# Patient Record
Sex: Female | Born: 1990 | Hispanic: No | Marital: Married | Smoking: Never smoker
Health system: Southern US, Community
[De-identification: ages and names within clinical notes are randomized; demographics above are authoritative.]

## PROBLEM LIST (undated history)

## (undated) DIAGNOSIS — R87629 Unspecified abnormal cytological findings in specimens from vagina: Secondary | ICD-10-CM

## (undated) DIAGNOSIS — N83209 Unspecified ovarian cyst, unspecified side: Secondary | ICD-10-CM

## (undated) DIAGNOSIS — G43009 Migraine without aura, not intractable, without status migrainosus: Secondary | ICD-10-CM

## (undated) DIAGNOSIS — Q612 Polycystic kidney, adult type: Secondary | ICD-10-CM

## (undated) DIAGNOSIS — J45909 Unspecified asthma, uncomplicated: Secondary | ICD-10-CM

## (undated) DIAGNOSIS — Q613 Polycystic kidney, unspecified: Secondary | ICD-10-CM

## (undated) DIAGNOSIS — N912 Amenorrhea, unspecified: Secondary | ICD-10-CM

## (undated) DIAGNOSIS — N390 Urinary tract infection, site not specified: Secondary | ICD-10-CM

## (undated) DIAGNOSIS — F419 Anxiety disorder, unspecified: Secondary | ICD-10-CM

## (undated) DIAGNOSIS — I10 Essential (primary) hypertension: Secondary | ICD-10-CM

## (undated) HISTORY — DX: Anxiety disorder, unspecified: F41.9

## (undated) HISTORY — DX: Amenorrhea, unspecified: N91.2

## (undated) HISTORY — DX: Unspecified asthma, uncomplicated: J45.909

## (undated) HISTORY — DX: Migraine without aura, not intractable, without status migrainosus: G43.009

## (undated) HISTORY — PX: WISDOM TOOTH EXTRACTION: SHX21

---

## 2012-08-10 ENCOUNTER — Ambulatory Visit (INDEPENDENT_AMBULATORY_CARE_PROVIDER_SITE_OTHER): Payer: BC Managed Care – PPO | Admitting: Family

## 2012-08-10 ENCOUNTER — Encounter: Payer: Self-pay | Admitting: Family

## 2012-08-10 VITALS — BP 120/78 | HR 87 | Ht 59.5 in | Wt 156.0 lb

## 2012-08-10 DIAGNOSIS — R42 Dizziness and giddiness: Secondary | ICD-10-CM

## 2012-08-10 DIAGNOSIS — G43909 Migraine, unspecified, not intractable, without status migrainosus: Secondary | ICD-10-CM

## 2012-08-10 LAB — CBC WITH DIFFERENTIAL/PLATELET
Basophils Absolute: 0 10*3/uL (ref 0.0–0.1)
HCT: 40.8 % (ref 36.0–46.0)
Lymphs Abs: 2.5 10*3/uL (ref 0.7–4.0)
MCV: 92.7 fl (ref 78.0–100.0)
Monocytes Absolute: 0.7 10*3/uL (ref 0.1–1.0)
Platelets: 222 10*3/uL (ref 150.0–400.0)
RDW: 12.8 % (ref 11.5–14.6)

## 2012-08-10 LAB — COMPREHENSIVE METABOLIC PANEL
ALT: 17 U/L (ref 0–35)
AST: 20 U/L (ref 0–37)
CO2: 23 mEq/L (ref 19–32)
Creatinine, Ser: 0.6 mg/dL (ref 0.4–1.2)
GFR: 127.43 mL/min (ref >60.00)
Total Bilirubin: 0.6 mg/dL (ref 0.3–1.2)

## 2012-08-10 LAB — TSH: TSH: 0.41 u[IU]/mL (ref 0.35–5.50)

## 2012-08-10 NOTE — Progress Notes (Signed)
  Subjective:    Patient ID: Rebecca [name], female    DOB: 1990-10-29, 22 y.o.   MRN: [id number]  HPI 22 year old white female, nonsmoker, new patient to the practice is in with complaints of lightheadedness that occurred 2 weeks ago while in gym class. She believes that the episode occurred because she did not eat breakfast prior to physical activity. However, she's requesting to have blood work to check for anemia and her blood sugar. 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   Review of Systems  Constitutional: Negative.   HENT: Negative.   Respiratory: Negative.   Cardiovascular: Negative.   Gastrointestinal: Negative.   Endocrine: Negative.   Genitourinary: Negative.   Musculoskeletal: Negative.   Skin: Negative.   Allergic/Immunologic: Negative.   Neurological: Positive for light-headedness.  Hematological: Negative.   Psychiatric/Behavioral: Negative.    History reviewed. No pertinent past medical history.  History   Social History  . Marital Status: Single    Spouse Name: N/A    Number of Children: N/A  . Years of Education: N/A   Occupational History  . Not on file.   Social History Main Topics  . Smoking status: Never Smoker   . Smokeless tobacco: Not on file  . Alcohol Use: No  . Drug Use: No  . Sexually Active: Not on file   Other Topics Concern  . Not on file   Social History Narrative  . No narrative on file    History reviewed. No pertinent past surgical history.  No family history on file.  No Known Allergies  No current outpatient prescriptions on file prior to visit.   No current facility-administered medications on file prior to visit.    BP 120/78  Pulse 87  Ht 4' 11.5" (1.511 m)  Wt 156 lb (70.761 kg)  BMI 30.99 kg/m2  SpO2 98%  LMP 02/21/2014chart    Objective:   Physical Exam  Constitutional: She is oriented to person, place, and time. She appears well-developed and well-nourished.  HENT:   Right Ear: External ear normal.  Left Ear: External ear normal.  Nose: Nose normal.  Mouth/Throat: Oropharynx is clear and moist.  Neck: Normal range of motion. Neck supple.  Cardiovascular: Normal rate, regular rhythm and normal heart sounds.   Pulmonary/Chest: Effort normal and breath sounds normal.  Abdominal: Soft. Bowel sounds are normal.  Musculoskeletal: Normal range of motion.  Neurological: She is alert and oriented to person, place, and time.  Skin: Skin is warm and dry.  Psychiatric: She has a normal mood and affect.          Assessment & Plan:  Assessment:  1. Lightheadedness  Plan: CBC, TSH, CMP sent. Will notify patient pending results. Encouraged healthy breakfast prior to physical activity. Followup in September for Pap smear.

## 2012-08-10 NOTE — Patient Instructions (Addendum)
Migraine Headache A migraine headache is an intense, throbbing pain on one or both sides of your head. A migraine can last for 30 minutes to several hours. CAUSES  The exact cause of a migraine headache is not always known. However, a migraine may be caused when nerves in the brain become irritated and release chemicals that cause inflammation. This causes pain. SYMPTOMS  Pain on one or both sides of your head.  Pulsating or throbbing pain.  Severe pain that prevents daily activities.  Pain that is aggravated by any physical activity.  Nausea, vomiting, or both.  Dizziness.  Pain with exposure to bright lights, loud noises, or activity.  General sensitivity to bright lights, loud noises, or smells. Before you get a migraine, you may get warning signs that a migraine is coming (aura). An aura may include:  Seeing flashing lights.  Seeing bright spots, halos, or zig-zag lines.  Having tunnel vision or blurred vision.  Having feelings of numbness or tingling.  Having trouble talking.  Having muscle weakness. MIGRAINE TRIGGERS  Alcohol.  Smoking.  Stress.  Menstruation.  Aged cheeses.  Foods or drinks that contain nitrates, glutamate, aspartame, or tyramine.  Lack of sleep.  Chocolate.  Caffeine.  Hunger.  Physical exertion.  Fatigue.  Medicines used to treat chest pain (nitroglycerine), birth control pills, estrogen, and some blood pressure medicines. DIAGNOSIS  A migraine headache is often diagnosed based on:  Symptoms.  Physical examination.  A CT scan or MRI of your head. TREATMENT Medicines may be given for pain and nausea. Medicines can also be given to help prevent recurrent migraines.  HOME CARE INSTRUCTIONS  Only take over-the-counter or prescription medicines for pain or discomfort as directed by your caregiver. The use of long-term narcotics is not recommended.  Lie down in a dark, quiet room when you have a migraine.  Keep a journal  to find out what may trigger your migraine headaches. For example, write down:  What you eat and drink.  How much sleep you get.  Any change to your diet or medicines.  Limit alcohol consumption.  Quit smoking if you smoke.  Get 7 to 9 hours of sleep, or as recommended by your caregiver.  Limit stress.  Keep lights dim if bright lights bother you and make your migraines worse. SEEK IMMEDIATE MEDICAL CARE IF:   Your migraine becomes severe.  You have a fever.  You have a stiff neck.  You have vision loss.  You have muscular weakness or loss of muscle control.  You start losing your balance or have trouble walking.  You feel faint or pass out.  You have severe symptoms that are different from your first symptoms. MAKE SURE YOU:   Understand these instructions.  Will watch your condition.  Will get help right away if you are not doing well or get worse. Document Released: 05/26/2005 Document Revised: 08/18/2011 Document Reviewed: 05/16/2011 [facility] Patient Information 2013 [place], [place].   Breast Self-Exam A self breast exam may help you find changes or problems while they are still small. Do a breast self-exam:  Every month.  One week after your period (menstrual period).  On the first day of each month if you do not have periods anymore. Look for any:  Change in breast color, size, or shape.  Dimples in your breast.  Changes in your nipples or skin.  Dry skin on your breasts or nipples.  Watery or bloody discharge from your nipples.  Feel for:  Lumps.  Thick, hard places.  Any other changes. HOME CARE There are 3 ways to do the breast self-exam: In front of a mirror.  Lift your arms over your head and turn side to side.  Put your hands on your hips and lean down, then turn from side to side.  Bend forward and turn from side to side. In the shower.  With soapy hands, check both breasts. Then check above and below your collarbone and  your armpits.  Feel above and below your collarbone down to under your breast, and from the center of your chest to the outer edge of the armpit. Check for any lumps or hard spots.  Using the tips of your middle three fingers check your whole breast by pressing your hand over your breast in a circle or in an up and down motion. Lying down.  Lie flat on your bed.  Put a small pillow under the breast you are going to check. On that same side, put your hand behind your head.  With your other hand, use the 3 middle fingers to feel the breast.  Move your fingers in a circle around the breast. Press firmly over all parts of the breast to feel for any lumps. GET HELP RIGHT AWAY IF: You find any changes in your breasts so they can be checked. Document Released: 11/12/2007 Document Revised: 08/18/2011 Document Reviewed: 09/13/2008 [facility] Patient Information 2013 [place], [place].

## 2013-01-14 ENCOUNTER — Telehealth: Payer: Self-pay | Admitting: Family

## 2013-01-14 ENCOUNTER — Encounter: Payer: Self-pay | Admitting: Family Medicine

## 2013-01-14 ENCOUNTER — Ambulatory Visit (INDEPENDENT_AMBULATORY_CARE_PROVIDER_SITE_OTHER): Payer: BC Managed Care – PPO | Admitting: Family Medicine

## 2013-01-14 VITALS — BP 120/80 | Temp 99.4°F | Wt 157.0 lb

## 2013-01-14 DIAGNOSIS — N946 Dysmenorrhea, unspecified: Secondary | ICD-10-CM

## 2013-01-14 NOTE — Progress Notes (Signed)
Chief Complaint  Patient presents with  . Abdominal Cramping    nausea this morning; dizziness     HPI:  Acute visit for menstrual cramping: -abd cramping on day periods start with periods - usually mild, sometimes worse -FDLMP: started today -had some cramping and nausea this morning with a little lightheadedness with the nausea -periods monthly, regular, 5-7 days, moderate flow -per review of chart had cbc, cmp and tsh recently and all were normal -denies: fevers, chills, urinary symptoms, blood in stools, vomiting, diarrhea  ROS: See pertinent positives and negatives per HPI.  No past medical history on file.  No family history on file.  History   Social History  . Marital Status: Single    Spouse Name: N/A    Number of Children: N/A  . Years of Education: N/A   Social History Main Topics  . Smoking status: Never Smoker   . Smokeless tobacco: None  . Alcohol Use: No  . Drug Use: No  . Sexually Active: None   Other Topics Concern  . None   Social History Narrative  . None    No current outpatient prescriptions on file.  EXAM:  Filed Vitals:   01/14/13 1525  BP: 120/80  Temp: 99.4 F (37.4 C)    Body mass index is 31.19 kg/(m^2).  GENERAL: vitals reviewed and listed above, alert, oriented, appears well hydrated and in no acute distress  HEENT: atraumatic, conjunttiva clear, no obvious abnormalities on inspection of external nose and ears  NECK: no obvious masses on inspection  LUNGS: clear to auscultation bilaterally, no wheezes, rales or rhonchi, good air movement  CV: HRRR, no peripheral edema  ABD: BS+, soft, NTTP, no rebound or guarding  MS: moves all extremities without noticeable abnormality  PSYCH: pleasant and cooperative, no obvious depression or anxiety  ASSESSMENT AND PLAN:  Discussed the following assessment and plan:  Menstrual cramps  -mild menstrual cramps today with normal exam and feeling better now -discussed  dysmenorrhea, causes, tx -will start with ibuprofen with periods as needed -follow up with PCP in 2 months -Patient advised to return or notify a doctor immediately if symptoms worsen or persist or new concerns arise.  There are no Patient Instructions on file for this visit.   Kriste Basque R.

## 2013-01-14 NOTE — Telephone Encounter (Signed)
Patient Information:  Caller Name: [name]  Phone: [phone]  Patient: Rebecca [name], Rebecca [name]  Gender: Female  DOB: April 18, 1991  Age: 22 Years  PCP: Adline Mango [facility])  Pregnant: No  Office Follow Up:  Does the office need to follow up with this patient?: No  Instructions For The Office: N/A   Symptoms  Reason For Call & Symptoms: Started period today 01/14/2013.  Woke with abdominal cramping at 10 am today.  Usually cramping but not this severe with periods.  At first pacing floor, now able to sit.  Says abdomen non-tender.  Some lightheadedness and dizzines at first but that has improved.  Some nausea at times.  Current pain at 7/10 level has not decreased since Rx at 12:30 pm  Reviewed Health History In EMR: Yes  Reviewed Medications In EMR: Yes  Reviewed Allergies In EMR: Yes  Reviewed Surgeries / Procedures: Yes  Date of Onset of Symptoms: 01/14/2013  Treatments Tried: Took Advil 3 pills at 12:30 pm  Treatments Tried Worked: No OB / GYN:  LMP: 01/14/2013  Guideline(s) Used:  Abdominal Pain - Female  Disposition Per Guideline:   Go to ED Now (or to Office with PCP Approval)  Reason For Disposition Reached:   Constant abdominal pain lasting > 2 hours  Advice Given:  Rest:  Lie down and rest until you feel better.  Fluids:  Sip clear fluids only (e.g., water, flat soft drinks or 1/2 strength fruit juice) until the pain has been gone for over 2 hours. Then slowly return to a regular diet.  Call Back If:  You become worse.  Patient Will Follow Care Advice:  YES  Appointment Scheduled:  01/14/2013 15:15:00 Appointment Scheduled Provider:  Kriste Basque [facility])

## 2013-01-14 NOTE — Telephone Encounter (Signed)
Noted  

## 2013-09-05 ENCOUNTER — Ambulatory Visit (INDEPENDENT_AMBULATORY_CARE_PROVIDER_SITE_OTHER): Payer: BC Managed Care – PPO | Admitting: Family

## 2013-09-05 ENCOUNTER — Encounter: Payer: Self-pay | Admitting: Family

## 2013-09-05 VITALS — BP 108/64 | HR 104 | Temp 98.8°F | Wt 160.0 lb

## 2013-09-05 DIAGNOSIS — J029 Acute pharyngitis, unspecified: Secondary | ICD-10-CM

## 2013-09-05 NOTE — Progress Notes (Signed)
   Subjective:    Patient ID: Rebecca [name], female    DOB: 05/12/1991, 23 y.o.   MRN: [id number]  HPI 23 y.o. White female presents today with chief complaint of "sore throat". Pt states that she had a cold 2 weeks ago and continues to have a sore throat. She states that she developed a low grade fever on Friday and that her throat was very sore on Sunday and her mother "thinks she saw pus". Pt rates pain as a 3 on a scale of 0-10 and describes it as scratch pain; the pain is constant but improving; she has not taken any medicine. Pt denies any chills, fatigue, malaise and change in appetite. Overall, pt states she feels much better then yesterday.     Review of Systems  Constitutional: Negative.   HENT: Positive for postnasal drip and sore throat.   Eyes: Negative.   Respiratory: Negative.   Cardiovascular: Negative.   Gastrointestinal: Negative.   Endocrine: Negative.   Genitourinary: Negative.   Musculoskeletal: Negative.   Skin: Negative.   Allergic/Immunologic: Negative.   Neurological: Positive for headaches.  Hematological: Negative.   Psychiatric/Behavioral: Negative.    No past medical history on file.  History   Social History  . Marital Status: Single    Spouse Name: N/A    Number of Children: N/A  . Years of Education: N/A   Occupational History  . Not on file.   Social History Main Topics  . Smoking status: Never Smoker   . Smokeless tobacco: Not on file  . Alcohol Use: No  . Drug Use: No  . Sexual Activity: Not on file   Other Topics Concern  . Not on file   Social History Narrative  . No narrative on file    No past surgical history on file.  No family history on file.  No Known Allergies  No current outpatient prescriptions on file prior to visit.   No current facility-administered medications on file prior to visit.    BP 108/64  Pulse 104  Temp(Src) 98.8 F (37.1 C) (Oral)  Wt 160 lb (72.576 kg)chart    Objective:   Physical Exam    Constitutional: She is oriented to person, place, and time. She appears well-developed and well-nourished. She is active.  HENT:  Head: Normocephalic.  Right Ear: Tympanic membrane, external ear and ear canal normal.  Left Ear: Tympanic membrane, external ear and ear canal normal.  Nose: Nose normal.  Mouth/Throat: Uvula is midline and mucous membranes are normal. Posterior oropharyngeal erythema present.  Neck: Normal range of motion. Neck supple.  Cardiovascular: Normal rate, regular rhythm, S1 normal, S2 normal and normal heart sounds.   Pulmonary/Chest: Effort normal and breath sounds normal.  Abdominal: Soft. Normal appearance and bowel sounds are normal.  Neurological: She is alert and oriented to person, place, and time.  Skin: Skin is warm, dry and intact.  Psychiatric: She has a normal mood and affect. Her speech is normal and behavior is normal. Thought content normal.          Assessment & Plan:  23 y.o. White female presents with chief complaint of "sore throat".  - Sore throat- Rapid strep test negative.  - IBuprofen 800mg  po TID for pain  - Warm salt water gargles as needed for sore throat.   - Follow up: as needed

## 2013-09-05 NOTE — Patient Instructions (Signed)
Take IBupofen up to 800mg  3x per day for sore throat as needed.   Sore Throat A sore throat is pain, burning, irritation, or scratchiness of the throat. There is often pain or tenderness when swallowing or talking. A sore throat may be accompanied by other symptoms, such as coughing, sneezing, fever, and swollen neck glands. A sore throat is often the first sign of another sickness, such as a cold, flu, strep throat, or mononucleosis (commonly known as mono). Most sore throats go away without medical treatment. CAUSES  The most common causes of a sore throat include:  A viral infection, such as a cold, flu, or mono.  A bacterial infection, such as strep throat, tonsillitis, or whooping cough.  Seasonal allergies.  Dryness in the air.  Irritants, such as smoke or pollution.  Gastroesophageal reflux disease (GERD). HOME CARE INSTRUCTIONS   Only take over-the-counter medicines as directed by your caregiver.  Drink enough fluids to keep your urine clear or pale yellow.  Rest as needed.  Try using throat sprays, lozenges, or sucking on hard candy to ease any pain (if older than 4 years or as directed).  Sip warm liquids, such as broth, herbal tea, or warm water with honey to relieve pain temporarily. You may also eat or drink cold or frozen liquids such as frozen ice pops.  Gargle with salt water (mix 1 tsp salt with 8 oz of water).  Do not smoke and avoid secondhand smoke.  Put a cool-mist humidifier in your bedroom at night to moisten the air. You can also turn on a hot shower and sit in the bathroom with the door closed for 5 10 minutes. SEEK IMMEDIATE MEDICAL CARE IF:  You have difficulty breathing.  You are unable to swallow fluids, soft foods, or your saliva.  You have increased swelling in the throat.  Your sore throat does not get better in 7 days.  You have nausea and vomiting.  You have a fever or persistent symptoms for more than 2 3 days.  You have a fever and  your symptoms suddenly get worse. MAKE SURE YOU:   Understand these instructions.  Will watch your condition.  Will get help right away if you are not doing well or get worse. Document Released: 07/03/2004 Document Revised: 05/12/2012 Document Reviewed: 02/01/2012 [facility]ExitCare Patient Information 2014 [place].

## 2013-09-05 NOTE — Progress Notes (Signed)
Pre visit review using our clinic review tool, if applicable. No additional management support is needed unless otherwise documented below in the visit note. 

## 2014-10-31 ENCOUNTER — Ambulatory Visit: Payer: Self-pay | Admitting: Obstetrics & Gynecology

## 2014-11-03 ENCOUNTER — Ambulatory Visit (INDEPENDENT_AMBULATORY_CARE_PROVIDER_SITE_OTHER): Payer: BLUE CROSS/BLUE SHIELD | Admitting: Obstetrics & Gynecology

## 2014-11-03 ENCOUNTER — Encounter: Payer: Self-pay | Admitting: Obstetrics & Gynecology

## 2014-11-03 VITALS — BP 108/70 | HR 80 | Resp 20 | Ht 60.0 in | Wt 158.0 lb

## 2014-11-03 DIAGNOSIS — Z124 Encounter for screening for malignant neoplasm of cervix: Secondary | ICD-10-CM

## 2014-11-03 DIAGNOSIS — Z01419 Encounter for gynecological examination (general) (routine) without abnormal findings: Secondary | ICD-10-CM

## 2014-11-03 DIAGNOSIS — G43009 Migraine without aura, not intractable, without status migrainosus: Secondary | ICD-10-CM | POA: Insufficient documentation

## 2014-11-03 HISTORY — DX: Migraine without aura, not intractable, without status migrainosus: G43.009

## 2014-11-03 MED ORDER — IBUPROFEN 800 MG PO TABS: 800.0000 mg | ORAL_TABLET | Freq: Three times a day (TID) | ORAL | Status: DC | PRN

## 2014-11-03 NOTE — Progress Notes (Signed)
24 y.o. G0P0000 SingleCaucasianF here for new patient appt/ annual exam.  Pt reports having worsening increased cramping at the beginning of her cycles.  Cycles are regular.  Flow lasts 5 days.  Heavier flow if for four days.  Changes four to five times a day.  Has tried advil for cramping and this does help.  Pt take two to three Advil twice daily if needed.    Completed Gardisil series.    Graduating this summer.  Works at [employer] at [employer].   Patient's last menstrual period was 10/11/2014.          Sexually active: No.  The current method of family planning is none.    Exercising: No.  not regularly Smoker:  no  Health Maintenance: Pap:  none History of abnormal Pap:  no MMG:  none Colonoscopy:  none BMD:   none TDaP:  UTD Screening Labs: PCP, Hb today: PCP-recent for vertigo, Urine today: not able to give sample   reports that she has never smoked. She has never used smokeless tobacco. She reports that she does not drink alcohol or use illicit drugs.  No past medical history on file.  Past Surgical History  Procedure Laterality Date  . Wisdom tooth extraction  2012/2013    Current Outpatient Prescriptions  Medication Sig Dispense Refill  . amoxicillin (AMOXIL) 500 MG capsule As needed  0   No current facility-administered medications for this visit.    Family History  Problem Relation Age of Onset  . Brain cancer Paternal Grandmother   . Diabetes Paternal Grandfather   . Diabetes Paternal Uncle   . Heart attack Maternal Grandfather   . Heart attack Paternal Grandfather   . Hypertension Mother   . Hypertension Father   . Hypertension Sister   . Polycystic kidney disease Mother     ROS:  Pertinent items are noted in HPI.  Otherwise, a comprehensive ROS was negative.  Exam:   BP 108/70 mmHg  Pulse 80  Resp 20  Ht 5' (1.524 m)  Wt 158 lb (71.668 kg)  BMI 30.86 kg/m2  LMP 10/11/2014  Height: 5' (152.4 cm)  Ht Readings from Last 3 Encounters:   11/03/14 5' (1.524 m)  08/10/12 4' 11.5" (1.511 m)    General appearance: alert, cooperative and appears stated age Head: Normocephalic, without obvious abnormality, atraumatic Neck: no adenopathy, supple, symmetrical, trachea midline and thyroid normal to inspection and palpation Lungs: clear to auscultation bilaterally Breasts: normal appearance, no masses or tenderness Heart: regular rate and rhythm Abdomen: soft, non-tender; bowel sounds normal; no masses,  no organomegaly Extremities: extremities normal, atraumatic, no cyanosis or edema Skin: Skin color, texture, turgor normal. No rashes or lesions Lymph nodes: Cervical, supraclavicular, and axillary nodes normal. No abnormal inguinal nodes palpated Neurologic: Grossly normal   Pelvic: External genitalia:  no lesions              Urethra:  normal appearing urethra with no masses, tenderness or lesions              Bartholins and Skenes: normal                 Vagina: normal appearing vagina with normal color and discharge, no lesions              Cervix: no lesions              Pap taken: Yes.   Bimanual Exam:  Uterus:  normal size, contour, position, consistency,  mobility, non-tender              Adnexa: normal adnexa and no mass, fullness, tenderness               Rectovaginal: Confirms               Anus:  normal sphincter tone, no lesions  Chaperone was present for exam.  A:  Well Woman with normal exam Migraine without aura  P:   Mammogram starting age 23-45 pap smear today Ibuprofen 800mg  every 8 hrs as needed for cramping.  #60/1Rf return annually or prn

## 2014-11-03 NOTE — Addendum Note (Signed)
Addended by: Jerene BearsMILLER, Ishmail Mcmanamon S on: 11/03/2014 05:15 PM   Modules accepted: Orders

## 2014-11-09 LAB — IPS PAP TEST WITH REFLEX TO HPV

## 2015-06-08 ENCOUNTER — Other Ambulatory Visit: Payer: Self-pay | Admitting: Gastroenterology

## 2015-06-08 DIAGNOSIS — R1013 Epigastric pain: Secondary | ICD-10-CM

## 2015-06-19 ENCOUNTER — Other Ambulatory Visit: Payer: Self-pay

## 2015-06-26 ENCOUNTER — Ambulatory Visit
Admission: RE | Admit: 2015-06-26 | Discharge: 2015-06-26 | Disposition: A | Discharge status: Home / Self Care | Payer: BLUE CROSS/BLUE SHIELD | Source: Ambulatory Visit | Attending: Gastroenterology | Admitting: Gastroenterology

## 2015-06-26 DIAGNOSIS — R1013 Epigastric pain: Secondary | ICD-10-CM

## 2016-09-01 ENCOUNTER — Other Ambulatory Visit: Payer: Self-pay | Admitting: Nephrology

## 2016-09-01 DIAGNOSIS — R109 Unspecified abdominal pain: Secondary | ICD-10-CM

## 2016-09-04 ENCOUNTER — Ambulatory Visit
Admission: RE | Admit: 2016-09-04 | Discharge: 2016-09-04 | Disposition: A | Discharge status: Home / Self Care | Payer: BLUE CROSS/BLUE SHIELD | Source: Ambulatory Visit | Attending: Nephrology | Admitting: Nephrology

## 2016-09-04 DIAGNOSIS — R109 Unspecified abdominal pain: Secondary | ICD-10-CM

## 2017-03-25 ENCOUNTER — Emergency Department (HOSPITAL_COMMUNITY)
Admission: EM | Admit: 2017-03-25 | Discharge: 2017-03-25 | Disposition: A | Discharge status: Home / Self Care | Payer: BLUE CROSS/BLUE SHIELD | Attending: Emergency Medicine | Admitting: Emergency Medicine

## 2017-03-25 ENCOUNTER — Encounter (HOSPITAL_COMMUNITY): Payer: Self-pay

## 2017-03-25 DIAGNOSIS — R112 Nausea with vomiting, unspecified: Secondary | ICD-10-CM | POA: Insufficient documentation

## 2017-03-25 DIAGNOSIS — Q613 Polycystic kidney, unspecified: Secondary | ICD-10-CM | POA: Diagnosis not present

## 2017-03-25 DIAGNOSIS — R197 Diarrhea, unspecified: Secondary | ICD-10-CM | POA: Diagnosis present

## 2017-03-25 HISTORY — DX: Polycystic kidney, unspecified: Q61.3

## 2017-03-25 LAB — COMPREHENSIVE METABOLIC PANEL WITH GFR
ALT: 11 U/L — ABNORMAL LOW (ref 14–54)
AST: 17 U/L (ref 15–41)
Albumin: 4.2 g/dL (ref 3.5–5.0)
Alkaline Phosphatase: 72 U/L (ref 38–126)
Anion gap: 8 (ref 5–15)
BUN: 10 mg/dL (ref 6–20)
CO2: 22 mmol/L (ref 22–32)
Calcium: 9.3 mg/dL (ref 8.9–10.3)
Chloride: 107 mmol/L (ref 101–111)
Creatinine, Ser: 0.71 mg/dL (ref 0.44–1.00)
GFR calc Af Amer: >60 mL/min
GFR calc non Af Amer: >60 mL/min
Glucose, Bld: 96 mg/dL (ref 65–99)
Potassium: 3.9 mmol/L (ref 3.5–5.1)
Sodium: 137 mmol/L (ref 135–145)
Total Bilirubin: 0.6 mg/dL (ref 0.3–1.2)
Total Protein: 6.7 g/dL (ref 6.5–8.1)

## 2017-03-25 LAB — CBC
HCT: 39.8 % (ref 36.0–46.0)
HEMOGLOBIN: 13.4 g/dL (ref 12.0–15.0)
MCH: 30.6 pg (ref 26.0–34.0)
MCHC: 33.7 g/dL (ref 30.0–36.0)
MCV: 90.9 fL (ref 78.0–100.0)
Platelets: 205 10*3/uL (ref 150–400)
RBC: 4.38 MIL/uL (ref 3.87–5.11)
RDW: 12.1 % (ref 11.5–15.5)
WBC: 8.9 10*3/uL (ref 4.0–10.5)

## 2017-03-25 LAB — URINALYSIS, ROUTINE W REFLEX MICROSCOPIC
BILIRUBIN URINE: NEGATIVE
Glucose, UA: NEGATIVE mg/dL
Hgb urine dipstick: NEGATIVE
Ketones, ur: 20 mg/dL — AB
Leukocytes, UA: NEGATIVE
NITRITE: NEGATIVE
PROTEIN: NEGATIVE mg/dL
SPECIFIC GRAVITY, URINE: 1.012 (ref 1.005–1.030)
pH: 6 (ref 5.0–8.0)

## 2017-03-25 LAB — LIPASE, BLOOD: Lipase: 27 U/L (ref 11–51)

## 2017-03-25 LAB — I-STAT BETA HCG BLOOD, ED (MC, WL, AP ONLY): I-stat hCG, quantitative: <5 m[IU]/mL

## 2017-03-25 MED ORDER — LOPERAMIDE HCL 2 MG PO CAPS
2.0000 mg | ORAL_CAPSULE | Freq: Once | ORAL | Status: AC
  Administered 2017-03-25: 2 mg via ORAL
  Filled 2017-03-25: qty 1

## 2017-03-25 MED ORDER — PANTOPRAZOLE SODIUM 40 MG IV SOLR
40.0000 mg | Freq: Once | INTRAVENOUS | Status: AC
  Administered 2017-03-25: 40 mg via INTRAVENOUS
  Filled 2017-03-25: qty 40

## 2017-03-25 MED ORDER — DICYCLOMINE HCL 20 MG PO TABS: 20.0000 mg | ORAL_TABLET | Freq: Three times a day (TID) | ORAL | 0 refills | Status: DC

## 2017-03-25 MED ORDER — SODIUM CHLORIDE 0.9 % IV BOLUS (SEPSIS)
1000.0000 mL | Freq: Once | INTRAVENOUS | Status: AC
  Administered 2017-03-25: 1000 mL via INTRAVENOUS

## 2017-03-25 MED ORDER — LOPERAMIDE HCL 2 MG PO CAPS: 2.0000 mg | ORAL_CAPSULE | Freq: Four times a day (QID) | ORAL | 0 refills | Status: DC | PRN

## 2017-03-25 MED ORDER — DICYCLOMINE HCL 10 MG/ML IM SOLN
20.0000 mg | Freq: Once | INTRAMUSCULAR | Status: AC
  Administered 2017-03-25: 20 mg via INTRAMUSCULAR
  Filled 2017-03-25: qty 2

## 2017-03-25 MED ORDER — ONDANSETRON 4 MG PO TBDP: 4.0000 mg | ORAL_TABLET | Freq: Four times a day (QID) | ORAL | 0 refills | Status: DC | PRN

## 2017-03-25 MED ORDER — ONDANSETRON HCL 4 MG/2ML IJ SOLN
4.0000 mg | Freq: Once | INTRAMUSCULAR | Status: AC
  Administered 2017-03-25: 4 mg via INTRAVENOUS
  Filled 2017-03-25: qty 2

## 2017-03-25 NOTE — ED Triage Notes (Addendum)
Pt coming from home. Pt states over the last two week she has had stomach pains and nausea and tonight it got worse and started to throw up and have severe abdominal pain. Pt also states she has been having diarrhea for over a week thought she had the stomach bug.

## 2017-03-25 NOTE — ED Provider Notes (Signed)
TIME SEEN: 5:57 AM  CHIEF COMPLAINT: nausea, vomiting and diarrhea  HPI: Pt is a 26 y.o. female with history of polycystic kidney disease who presents to the emergency department with complaints of diarrhea for the past week. She has noted no more than 3 episodes of nonbloody diarrhea every day. She had 2 episodes of clear vomiting today. She feels that she is having upper abdominal discomfort from vomiting and feels bloated and having cramps. No fevers, chills. No dysuria, hematuria, vaginal bleeding or discharge. Last menstrual period was the end of September. No history of previous abdominal surgery. No sick contacts, recent travel, recent antibiotic use or hospitalization.  ROS: See HPI Constitutional: no fever  Eyes: no drainage  ENT: no runny nose   Cardiovascular:  no chest pain  Resp: no SOB  GI: no vomiting GU: no dysuria Integumentary: no rash  Allergy: no hives  Musculoskeletal: no leg swelling  Neurological: no slurred speech ROS otherwise negative  PAST MEDICAL HISTORY/PAST SURGICAL HISTORY:  Past Medical History:  Diagnosis Date  . Polycystic kidney disease     MEDICATIONS:  Prior to Admission medications   Medication Sig Start Date End Date Taking? Authorizing Provider  clidinium-chlordiazePOXIDE (LIBRAX) 5-2.5 MG per capsule Take 1 capsule by mouth as needed.    [provider]  ibuprofen (ADVIL,MOTRIN) 800 MG tablet Take 1 tablet (800 mg total) by mouth every 8 (eight) hours as needed. 11/03/14   Jerene BearsMiller, Mary S, MD    ALLERGIES:  Allergies  Allergen Reactions  . Ibuprofen Anaphylaxis    Angioedema    SOCIAL HISTORY:  Social History  Substance Use Topics  . Smoking status: Never Smoker  . Smokeless tobacco: Never Used  . Alcohol use No    FAMILY HISTORY: Family History  Problem Relation Age of Onset  . Brain cancer Paternal Grandmother   . Diabetes Paternal Grandfather   . Heart attack Paternal Grandfather   . Diabetes Paternal Uncle   .  Heart attack Maternal Grandfather   . Hypertension Mother   . Polycystic kidney disease Mother   . Hypertension Father   . Hypertension Sister     EXAM: BP (!) 126/92   Pulse 79   Temp 98.8 F (37.1 C) (Oral)   Resp 13   Ht 5' (1.524 m)   Wt 72.6 kg (160 lb)   LMP 03/08/2017   SpO2 100%   BMI 31.25 kg/m  CONSTITUTIONAL: Alert and oriented and responds appropriately to questions. Well-appearing; well-nourished, afebrile, smiling, nontoxic, appears well-hydrated HEAD: Normocephalic EYES: Conjunctivae clear, pupils appear equal, EOMI ENT: normal nose; moist mucous membranes NECK: Supple, no meningismus, no nuchal rigidity, no LAD  CARD: RRR; S1 and S2 appreciated; no murmurs, no clicks, no rubs, no gallops RESP: Normal chest excursion without splinting or tachypnea; breath sounds clear and equal bilaterally; no wheezes, no rhonchi, no rales, no hypoxia or respiratory distress, speaking full sentences ABD/GI: Normal bowel sounds; non-distended; soft, non-tender, no rebound, no guarding, no peritoneal signs, no hepatosplenomegaly, negative Murphy sign, no tenderness at McBurney's point BACK:  The back appears normal and is non-tender to palpation, there is no CVA tenderness EXT: Normal ROM in all joints; non-tender to palpation; no edema; normal capillary refill; no cyanosis, no calf tenderness or swelling    SKIN: Normal color for age and race; warm; no rash NEURO: Moves all extremities equally PSYCH: The patient's mood and manner are appropriate. Grooming and personal hygiene are appropriate.  MEDICAL DECISION MAKING: Pt here with nausea,  vomiting and diarrhea. Suspect viral illness. Her abdominal exam is completely benign. Doubt appendicitis, colitis, diverticulitis, cholecystitis, pancreatitis, bowel obstruction. We'll treat symptomatically with IV fluids, Bentyl, Zofran, Imodium. Doubt bacterial infectious diarrhea at this time. We'll check abdominal labs, urine.  ED PROGRESS:  Patient's labs showed no leukocytosis. Normal creatinine, LFTs, lipase. Normal electrolytes.  Pregnancy test negative. She is tolerating fluids without difficulty.  7:10 AM  Pt reports feeling much better. Able to drink without difficulty. No further vomiting. Repeat abdominal exam still completely benign.  Pt's UA shows no infection.  She has small ketones but received IVF and is drinking oral fluids without difficulty.  Pt and mother comfortable with discharge.   At this time, I do not feel there is any life-threatening condition present. I have reviewed and discussed all results (EKG, imaging, lab, urine as appropriate) and exam findings with patient/family. I have reviewed nursing notes and appropriate previous records.  I feel the patient is safe to be discharged home without further emergent workup and can continue workup as an outpatient as needed. Discussed usual and customary return precautions. Patient/family verbalize understanding and are comfortable with this plan.  Outpatient follow-up has been provided if needed. All questions have been answered.    Zamaria Brazzle, Layla Maw, DO 03/25/17 [phone]

## 2017-03-25 NOTE — ED Notes (Signed)
Pt able to tolerate fluids 

## 2018-07-06 ENCOUNTER — Other Ambulatory Visit: Payer: Self-pay | Admitting: Nephrology

## 2018-07-06 DIAGNOSIS — Q612 Polycystic kidney, adult type: Secondary | ICD-10-CM

## 2018-07-15 ENCOUNTER — Ambulatory Visit
Admission: RE | Admit: 2018-07-15 | Discharge: 2018-07-15 | Disposition: A | Discharge status: Home / Self Care | Payer: BLUE CROSS/BLUE SHIELD | Source: Ambulatory Visit | Attending: Nephrology | Admitting: Nephrology

## 2018-07-15 DIAGNOSIS — Q612 Polycystic kidney, adult type: Secondary | ICD-10-CM

## 2019-06-17 ENCOUNTER — Encounter: Payer: Self-pay | Admitting: Obstetrics and Gynecology

## 2019-07-01 ENCOUNTER — Encounter: Payer: BC Managed Care – PPO | Admitting: Obstetrics and Gynecology

## 2019-07-21 ENCOUNTER — Other Ambulatory Visit: Payer: Self-pay

## 2019-07-21 ENCOUNTER — Other Ambulatory Visit (HOSPITAL_COMMUNITY)
Admission: RE | Admit: 2019-07-21 | Discharge: 2019-07-21 | Disposition: A | Discharge status: Home / Self Care | Payer: BC Managed Care – PPO | Source: Ambulatory Visit | Attending: Obstetrics and Gynecology | Admitting: Obstetrics and Gynecology

## 2019-07-21 ENCOUNTER — Ambulatory Visit: Payer: BC Managed Care – PPO | Admitting: Obstetrics and Gynecology

## 2019-07-21 ENCOUNTER — Encounter: Payer: Self-pay | Admitting: Obstetrics and Gynecology

## 2019-07-21 VITALS — BP 130/78 | HR 107 | Temp 98.1°F | Ht 60.0 in | Wt 139.0 lb

## 2019-07-21 DIAGNOSIS — Z113 Encounter for screening for infections with a predominantly sexual mode of transmission: Secondary | ICD-10-CM

## 2019-07-21 DIAGNOSIS — G43009 Migraine without aura, not intractable, without status migrainosus: Secondary | ICD-10-CM | POA: Diagnosis not present

## 2019-07-21 DIAGNOSIS — Z01419 Encounter for gynecological examination (general) (routine) without abnormal findings: Secondary | ICD-10-CM

## 2019-07-21 DIAGNOSIS — Z124 Encounter for screening for malignant neoplasm of cervix: Secondary | ICD-10-CM | POA: Diagnosis not present

## 2019-07-21 DIAGNOSIS — Z3009 Encounter for other general counseling and advice on contraception: Secondary | ICD-10-CM

## 2019-07-21 NOTE — Patient Instructions (Signed)
EXERCISE AND DIET:  We recommended that you start or continue a regular exercise program for good health. Regular exercise means any activity that makes your heart beat faster and makes you sweat.  We recommend exercising at least 30 minutes per day at least 3 days a week, preferably 4 or 5.  We also recommend a diet low in fat and sugar.  Inactivity, poor dietary choices and obesity can cause diabetes, heart attack, stroke, and kidney damage, among others.    ALCOHOL AND SMOKING:  Women should limit their alcohol intake to no more than 7 drinks/beers/glasses of wine (combined, not each!) per week. Moderation of alcohol intake to this level decreases your risk of breast cancer and liver damage. And of course, no recreational drugs are part of a healthy lifestyle.  And absolutely no smoking or even second hand smoke. Most people know smoking can cause heart and lung diseases, but did you know it also contributes to weakening of your bones? Aging of your skin?  Yellowing of your teeth and nails?  CALCIUM AND VITAMIN D:  Adequate intake of calcium and Vitamin D are recommended.  The recommendations for exact amounts of these supplements seem to change often, but generally speaking 1,000 mg of calcium (between diet and supplement) and 800 units of Vitamin D per day seems prudent. Certain women may benefit from higher intake of Vitamin D.  If you are among these women, your doctor will have told you during your visit.    PAP SMEARS:  Pap smears, to check for cervical cancer or precancers,  have traditionally been done yearly, although recent scientific advances have shown that most women can have pap smears less often.  However, every woman still should have a physical exam from her gynecologist every year. It will include a breast check, inspection of the vulva and vagina to check for abnormal growths or skin changes, a visual exam of the cervix, and then an exam to evaluate the size and shape of the uterus and  ovaries.  And after 28 years of age, a rectal exam is indicated to check for rectal cancers. We will also provide age appropriate advice regarding health maintenance, like when you should have certain vaccines, screening for sexually transmitted diseases, bone density testing, colonoscopy, mammograms, etc.   MAMMOGRAMS:  All women over 40 years old should have a yearly mammogram. Many facilities now offer a "3D" mammogram, which may cost around $50 extra out of pocket. If possible,  we recommend you accept the option to have the 3D mammogram performed.  It both reduces the number of women who will be called back for extra views which then turn out to be normal, and it is better than the routine mammogram at detecting truly abnormal areas.    COLON CANCER SCREENING: Now recommend starting at age 45. At this time colonoscopy is not covered for routine screening until 50. There are take home tests that can be done between 45-49.   COLONOSCOPY:  Colonoscopy to screen for colon cancer is recommended for all women at age 50.  We know, you hate the idea of the prep.  We agree, BUT, having colon cancer and not knowing it is worse!!  Colon cancer so often starts as a polyp that can be seen and removed at colonscopy, which can quite literally save your life!  And if your first colonoscopy is normal and you have no family history of colon cancer, most women don't have to have it again for   10 years.  Once every ten years, you can do something that may end up saving your life, right?  We will be happy to help you get it scheduled when you are ready.  Be sure to check your insurance coverage so you understand how much it will cost.  It may be covered as a preventative service at no cost, but you should check your particular policy.      Breast Self-Awareness Breast self-awareness means being familiar with how your breasts look and feel. It involves checking your breasts regularly and reporting any changes to your  health care provider. Practicing breast self-awareness is important. A change in your breasts can be a sign of a serious medical problem. Being familiar with how your breasts look and feel allows you to find any problems early, when treatment is more likely to be successful. All women should practice breast self-awareness, including women who have had breast implants. How to do a breast self-exam One way to learn what is normal for your breasts and whether your breasts are changing is to do a breast self-exam. To do a breast self-exam: Look for Changes  1. Remove all the clothing above your waist. 2. Stand in front of a mirror in a room with good lighting. 3. Put your hands on your hips. 4. Push your hands firmly downward. 5. Compare your breasts in the mirror. Look for differences between them (asymmetry), such as: ? Differences in shape. ? Differences in size. ? Puckers, dips, and bumps in one breast and not the other. 6. Look at each breast for changes in your skin, such as: ? Redness. ? Scaly areas. 7. Look for changes in your nipples, such as: ? Discharge. ? Bleeding. ? Dimpling. ? Redness. ? A change in position. Feel for Changes Carefully feel your breasts for lumps and changes. It is best to do this while lying on your back on the floor and again while sitting or standing in the shower or tub with soapy water on your skin. Feel each breast in the following way:  Place the arm on the side of the breast you are examining above your head.  Feel your breast with the other hand.  Start in the nipple area and make  inch (2 cm) overlapping circles to feel your breast. Use the pads of your three middle fingers to do this. Apply light pressure, then medium pressure, then firm pressure. The light pressure will allow you to feel the tissue closest to the skin. The medium pressure will allow you to feel the tissue that is a little deeper. The firm pressure will allow you to feel the tissue  close to the ribs.  Continue the overlapping circles, moving downward over the breast until you feel your ribs below your breast.  Move one finger-width toward the center of the body. Continue to use the  inch (2 cm) overlapping circles to feel your breast as you move slowly up toward your collarbone.  Continue the up and down exam using all three pressures until you reach your armpit.  Write Down What You Find  Write down what is normal for each breast and any changes that you find. Keep a written record with breast changes or normal findings for each breast. By writing this information down, you do not need to depend only on memory for size, tenderness, or location. Write down where you are in your menstrual cycle, if you are still menstruating. If you are having trouble noticing differences   in your breasts, do not get discouraged. With time you will become more familiar with the variations in your breasts and more comfortable with the exam. How often should I examine my breasts? Examine your breasts every month. If you are breastfeeding, the best time to examine your breasts is after a feeding or after using a breast pump. If you menstruate, the best time to examine your breasts is 5-7 days after your period is over. During your period, your breasts are lumpier, and it may be more difficult to notice changes. When should I see my health care provider? See your health care provider if you notice:  A change in shape or size of your breasts or nipples.  A change in the skin of your breast or nipples, such as a reddened or scaly area.  Unusual discharge from your nipples.  A lump or thick area that was not there before.  Pain in your breasts.  Anything that concerns you.  Oral Contraception Information Oral contraceptive pills (OCPs) are medicines taken to prevent pregnancy. OCPs are taken by mouth, and they work by:  Preventing the ovaries from releasing eggs.  Thickening mucus in  the lower part of the uterus (cervix), which prevents sperm from entering the uterus.  Thinning the lining of the uterus (endometrium), which prevents a fertilized egg from attaching to the endometrium. OCPs are highly effective when taken exactly as prescribed. However, OCPs do not prevent STIs (sexually transmitted infections). Safe sex practices, such as using condoms while on an OCP, can help prevent STIs. Before starting OCPs Before you start taking OCPs, you may have a physical exam, blood test, and Pap test. However, you are not required to have a pelvic exam in order to be prescribed OCPs. Your health care provider will make sure you are a good candidate for oral contraception. OCPs are not a good option for certain women, including women who smoke and are older than 35 years, and women with a medical history of high blood pressure, deep vein thrombosis, pulmonary embolism, stroke, cardiovascular disease, or peripheral vascular disease. Discuss with your health care provider the possible side effects of the OCP you may be prescribed. When you start an OCP, be aware that it can take 2-3 months for your body to adjust to changes in hormone levels. Follow instructions from your health care provider about how to start taking your first cycle of OCPs. Depending on when you start the pill, you may need to use a backup form of birth control, such as condoms, during the first week. Make sure you know what steps to take if you ever forget to take the pill. Types of oral contraception  The most common types of birth control pills contain the hormones estrogen and progestin (synthetic progesterone) or progestin only. The combination pill This type of pill contains estrogen and progestin hormones. Combination pills often come in packs of 21, 28, or 91 pills. For each pack, the last 7 pills may not contain hormones, which means you may stop taking the pills for 7 days. Menstrual bleeding occurs during the  week that you do not take the pills or that you take the pills with no hormones in them. The minipill This type of pill contains the progestin hormone only. It comes in packs of 28 pills. All 28 pills contain the hormone. You take the pill every day. It is very important to take the pill at the same time each day. Advantages of oral contraceptive pills    Provides reliable and continuous contraception if taken as instructed.  May treat or decrease symptoms of: ? Menstrual period cramps. ? Irregular menstrual cycle or bleeding. ? Heavy menstrual flow. ? Abnormal uterine bleeding. ? Acne, depending on the type of pill. ? Polycystic ovarian syndrome. ? Endometriosis. ? Iron deficiency anemia. ? Premenstrual symptoms, including premenstrual dysphoric disorder.  May reduce the risk of endometrial and ovarian cancer.  Can be used as emergency contraception.  Prevents mislocated (ectopic) pregnancies and infections of the fallopian tubes. Things that can make oral contraceptive pills less effective OCPs can be less effective if:  You forget to take the pill at the same time every day. This is especially important when taking the minipill.  You have a stomach or intestinal disease that reduces your body's ability to absorb the pill.  You take OCPs with other medicines that make OCPs less effective, such as antibiotics, certain HIV medicines, and some seizure medicines.  You take expired OCPs.  You forget to restart the pill on day 7, if using the packs of 21 pills. Risks associated with oral contraceptive pills Oral contraceptive pills can sometimes cause side effects, such as:  Headache.  Depression.  Trouble sleeping.  Nausea and vomiting.  Breast tenderness.  Irregular bleeding or spotting during the first several months.  Bloating or fluid retention.  Increase in blood pressure. Combination pills are also associated with a small increase in the risk of:  Blood  clots.  Heart attack.  Stroke. Summary  Oral contraceptive pills are medicines taken by mouth to prevent pregnancy. They are highly effective when taken exactly as prescribed.  The most common types of birth control pills contain the hormones estrogen and progestin (synthetic progesterone) or progestin only.  Before you start taking the pill, you may have a physical exam, blood test, and Pap test. Your health care provider will make sure you are a good candidate for oral contraception.  The combination pill may come in a 21-day pack, a 28-day pack, or a 91-day pack. The minipill contains the progesterone hormone only and comes in packs of 28 pills.  Oral contraceptive pills can sometimes cause side effects, such as headache, nausea, breast tenderness, or irregular bleeding. This information is not intended to replace advice given to you by your health care provider. Make sure you discuss any questions you have with your health care provider. Document Revised: 05/08/2017 Document Reviewed: 08/19/2016 Elsevier Patient Education  2020 [employer] Inc[employer]  

## 2019-07-21 NOTE — Progress Notes (Signed)
29 y.o. G0P0000 Single Other or two or more races Not Hispanic or Latino female here for annual exam She states that she has not had a check up in a long time.  Sexually active, only partner, together x 6 months.  Period Cycle (Days): 28 Period Duration (Days): 6 Period Pattern: Regular Menstrual Flow: Moderate Menstrual Control: Thin pad, Tampon Menstrual Control Change Freq (Hours): 3-4 Dysmenorrhea: (!) Severe(Diarrhea and Headaches are Moderate.) Dysmenorrhea Symptoms: Cramping, Diarrhea, Headache  Cramps have gotten worse with time. She can't take NSAID's, tylenol helps a little. Not typically missing work with the cramps. 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   Patient's last menstrual period was 07/08/2019.          Sexually active: Yes.    The current method of family planning is condoms most of the time.    Exercising: No.  The patient does not participate in regular exercise at present. Smoker:  no  Health Maintenance: Pap:  11/03/14 normal HPV NEG  History of abnormal Pap:  no TDaP:  Maybe 10 years, she declines.  Gardasil: All 3    reports that she has never smoked. She has never used smokeless tobacco. She reports that she does not drink alcohol or use drugs. 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 Working from home with [place].   Past Medical History:  Diagnosis Date  . Amenorrhea   . Anxiety   . Migraine without aura   . Polycystic kidney disease     Past Surgical History:  Procedure Laterality Date  . WISDOM TOOTH EXTRACTION  2012/2013    Current Outpatient Medications  Medication Sig Dispense Refill  . albuterol (VENTOLIN HFA) 108 (90 Base) MCG/ACT inhaler Inhale 1 puff into the lungs 4 (four) times daily as needed.    . dicyclomine (BENTYL) 20 MG tablet Take 1 tablet (20 mg total) by mouth 3 (three) times daily before meals. As needed for abdominal cramping 20 tablet 0   No current facility-administered medications for this visit.    Family History   Problem Relation Age of Onset  . Brain cancer Paternal Grandmother   . Diabetes Paternal Grandfather   . Heart attack Paternal Grandfather   . Diabetes Paternal Uncle   . Heart attack Maternal Grandfather   . Hypertension Mother   . Polycystic kidney disease Mother   . Hypertension Father   . Hypertension Sister   . Breast cancer Sister 78       invasive ductal cancer, negative genetic testing    Review of Systems  All other systems reviewed and are negative.   Exam:   BP 130/78   Pulse (!) 107   Temp 98.1 F (36.7 C)   Ht 5' (1.524 m)   Wt 139 lb (63 kg)   LMP 07/08/2019   SpO2 99%   BMI 27.15 kg/m   Weight change: @WEIGHTCHANGE @ Height:   Height: 5' (152.4 cm)  Ht Readings from Last 3 Encounters:  07/21/19 5' (1.524 m)  03/25/17 5' (1.524 m)  11/03/14 5' (1.524 m)    General appearance: alert, cooperative and appears stated age Head: Normocephalic, without obvious abnormality, atraumatic Neck: no adenopathy, supple, symmetrical, trachea midline and thyroid normal to inspection and palpation Lungs: clear to auscultation bilaterally Cardiovascular: regular rate and rhythm Breasts: normal appearance, no masses or tenderness Abdomen: soft, non-tender; non distended,  no masses,  no organomegaly Extremities: extremities normal, atraumatic, no cyanosis or edema Skin: Skin color, texture, turgor normal. No rashes  or lesions Lymph nodes: Cervical, supraclavicular, and axillary nodes normal. No abnormal inguinal nodes palpated Neurologic: Grossly normal   Pelvic: External genitalia:  no lesions              Urethra:  normal appearing urethra with no masses, tenderness or lesions              Bartholins and Skenes: normal                 Vagina: normal appearing vagina with normal color and discharge, no lesions              Cervix: no lesions               Bimanual Exam:  Uterus:  normal size, contour, position, consistency, mobility, non-tender and anteverted               Adnexa: no mass, fullness, tenderness               Rectovaginal: Confirms               Anus:  normal sphincter tone, no lesions  Carolynn Serve chaperoned for the exam.  A:  Well Woman with normal exam  Contraception  Severe dysmenorrhea     P:   Pap with reflex hpv, STD testing  Declines blood work  Getting blood work with her nephrologist   Discussed breast self exam  Discussed calcium and vit D intake  Discussed contraception options, leaning toward the mirena IUD.

## 2019-07-22 LAB — CYTOLOGY - PAP
Chlamydia: NEGATIVE
Comment: NEGATIVE
Comment: NORMAL
Diagnosis: NEGATIVE
Neisseria Gonorrhea: NEGATIVE

## 2019-07-24 ENCOUNTER — Ambulatory Visit: Payer: BC Managed Care – PPO

## 2019-07-27 ENCOUNTER — Telehealth: Payer: Self-pay | Admitting: Obstetrics and Gynecology

## 2019-07-27 NOTE — Telephone Encounter (Signed)
Call placed to convey benefits for Mirena. Spoke with the patient and conveyed the benefits. Patient understands/agreeable with the benefits. Patient to call at onset of cycle.

## 2019-08-01 NOTE — Telephone Encounter (Signed)
Spoke with patient. LMP 08/01/19, requesting to proceed with scheduling Mirena IUD insertion discussed at 07/21/19 AEX.   Scheduled IUD insertion for 2/24 at 3pm with Dr. Oscar La.  Patient has a document allergy to Ibuprofen, advised to take 2 regular strength tylenol 1 hr prior to appt. Take with food and hydrate well.    Patient verbalizes understanding and is agreeable.   Routing to provider for final review. Patient is agreeable to disposition. Will close encounter.  Cc: Soundra Pilon, [address] [place]

## 2019-08-01 NOTE — Telephone Encounter (Signed)
Patient started cycle and calling to schedule iud insertion.

## 2019-08-03 ENCOUNTER — Encounter: Payer: Self-pay | Admitting: Obstetrics and Gynecology

## 2019-08-03 ENCOUNTER — Other Ambulatory Visit: Payer: Self-pay

## 2019-08-03 ENCOUNTER — Ambulatory Visit (INDEPENDENT_AMBULATORY_CARE_PROVIDER_SITE_OTHER): Payer: BC Managed Care – PPO | Admitting: Obstetrics and Gynecology

## 2019-08-03 VITALS — BP 130/70 | HR 94 | Temp 98.1°F | Ht 60.0 in | Wt 138.0 lb

## 2019-08-03 DIAGNOSIS — Z3009 Encounter for other general counseling and advice on contraception: Secondary | ICD-10-CM | POA: Diagnosis not present

## 2019-08-03 DIAGNOSIS — Z01812 Encounter for preprocedural laboratory examination: Secondary | ICD-10-CM

## 2019-08-03 DIAGNOSIS — Z30430 Encounter for insertion of intrauterine contraceptive device: Secondary | ICD-10-CM

## 2019-08-03 LAB — POCT URINE PREGNANCY: Preg Test, Ur: NEGATIVE

## 2019-08-03 MED ORDER — DOXYCYCLINE HYCLATE 100 MG PO CAPS: 100.0000 mg | ORAL_CAPSULE | Freq: Two times a day (BID) | ORAL | 0 refills | Status: DC

## 2019-08-03 NOTE — Progress Notes (Signed)
GYNECOLOGY  VISIT   HPI: 29 y.o.   Single Other or two or more races Not Hispanic or Latino  female   G0P0000 with Patient's last menstrual period was 07/08/2019.   here for Mirena IUD insertion. Negative cervical cultures earlier this week.   GYNECOLOGIC HISTORY: Patient's last menstrual period was 07/08/2019. Contraception:none  Menopausal hormone therapy: none         OB History    Gravida  0   Para  0   Term  0   Preterm  0   AB  0   Living  0     SAB  0   TAB  0   Ectopic  0   Multiple  0   Live Births                 Patient Active Problem List   Diagnosis Date Noted  . Migraine without aura   . Migraine headache without aura 11/03/2014    Past Medical History:  Diagnosis Date  . Amenorrhea   . Anxiety   . Migraine without aura   . Polycystic kidney disease     Past Surgical History:  Procedure Laterality Date  . WISDOM TOOTH EXTRACTION  2012/2013    Current Outpatient Medications  Medication Sig Dispense Refill  . albuterol (VENTOLIN HFA) 108 (90 Base) MCG/ACT inhaler Inhale 1 puff into the lungs 4 (four) times daily as needed.    . dicyclomine (BENTYL) 20 MG tablet Take 1 tablet (20 mg total) by mouth 3 (three) times daily before meals. As needed for abdominal cramping 20 tablet 0   No current facility-administered medications for this visit.     ALLERGIES: Ibuprofen  Family History  Problem Relation Age of Onset  . Brain cancer Paternal Grandmother   . Diabetes Paternal Grandfather   . Heart attack Paternal Grandfather   . Diabetes Paternal Uncle   . Heart attack Maternal Grandfather   . Hypertension Mother   . Polycystic kidney disease Mother   . Hypertension Father   . Hypertension Sister   . Breast cancer Sister 63       invasive ductal cancer, negative genetic testing    Social History   Socioeconomic History  . Marital status: Single    Spouse name: Not on file  . Number of children: Not on file  . Years of  education: Not on file  . Highest education level: Not on file  Occupational History  . Not on file  Tobacco Use  . Smoking status: Never Smoker  . Smokeless tobacco: Never Used  Substance and Sexual Activity  . Alcohol use: No    Alcohol/week: 0.0 standard drinks  . Drug use: No  . Sexual activity: Yes    Partners: Male  Other Topics Concern  . Not on file  Social History Narrative  . Not on file   Social Determinants of Health   Financial Resource Strain:   . Difficulty of Paying Living Expenses: Not on file  Food Insecurity:   . Worried About Charity fundraiser in the Last Year: Not on file  . Ran Out of Food in the Last Year: Not on file  Transportation Needs:   . Lack of Transportation (Medical): Not on file  . Lack of Transportation (Non-Medical): Not on file  Physical Activity:   . Days of Exercise per Week: Not on file  . Minutes of Exercise per Session: Not on file  Stress:   . Feeling  of Stress : Not on file  Social Connections:   . Frequency of Communication with Friends and Family: Not on file  . Frequency of Social Gatherings with Friends and Family: Not on file  . Attends Religious Services: Not on file  . Active Member of Clubs or Organizations: Not on file  . Attends Banker Meetings: Not on file  . Marital Status: Not on file  Intimate Partner Violence:   . Fear of Current or Ex-Partner: Not on file  . Emotionally Abused: Not on file  . Physically Abused: Not on file  . Sexually Abused: Not on file    Review of Systems  All other systems reviewed and are negative.   PHYSICAL EXAMINATION:    LMP 07/08/2019     General appearance: alert, cooperative and appears stated age  Pelvic: External genitalia:  no lesions              Urethra:  normal appearing urethra with no masses, tenderness or lesions              Bartholins and Skenes: normal                 Vagina: normal appearing vagina with normal color and discharge, no  lesions              Cervix: no lesions  The risks of the mirena IUD were reviewed with the patient, including infection, abnormal bleeding and uterine perfortion. Consent was signed.  A speculum was placed in the vagina, the cervix was cleansed with betadine. A tenaculum was placed on the cervix, the uterus sounded to 7 cm. The cervix was dilated with a mini dilator up to a #5 hagar dilator  The mirnea IUD was inserted without difficulty. The string were cut to 3-4 cm. The tenaculum was removed. Slight oozing from the tenaculum site was stopped with pressure.   The patient tolerated the procedure well.    Chaperone was present for exam.  ASSESSMENT Mirena IUD insertion    PLAN F/U in one month   An After Visit Summary was printed and given to the patient.

## 2019-08-03 NOTE — Patient Instructions (Signed)
IUD Post-procedure Instructions Cramping is common.  You may take Ibuprofen, Aleve, or Tylenol for the cramping.  This should resolve within 24 hours.   You may have a small amount of spotting.  You should wear a mini pad for the next few days. You may have intercourse in 24 hours. You need to call the office if you have any pelvic pain, fever, heavy bleeding, or foul smelling vaginal discharge. Shower or bathe as normal Use back up contraception for one week 

## 2019-08-09 ENCOUNTER — Telehealth: Payer: Self-pay | Admitting: *Deleted

## 2019-08-09 NOTE — Telephone Encounter (Signed)
Spoke with pt. Pt had IUD insertion on 08/03/2019 while on cycle. Pt states having cramps only in afternoon for a couple of hours  and having light bleeding with wearing only a panty liner that she can go all day wearing. Pain rated at 4 but better with tylenol. Pt has been using heating pad too. Pt cannot take Ibuprofen because of allergy. Pt denies heavy bleeding or clots or severe cramps or abd pain. Pt advised on what is normal for IUD insertion. Pt agreeable and has f/u IUD insertion recheck on 09/02/2019. Pt verbalized to call back to office if any further questions or concerns.   Routing to Dr Oscar La for any additional recommendations.

## 2019-08-09 NOTE — Telephone Encounter (Signed)
Had IUD inserted last week. Still having cramping would like to review with nurse.

## 2019-08-18 ENCOUNTER — Ambulatory Visit: Payer: BC Managed Care – PPO | Attending: Internal Medicine

## 2019-08-18 DIAGNOSIS — Z23 Encounter for immunization: Secondary | ICD-10-CM

## 2019-08-18 NOTE — Progress Notes (Signed)
   Covid-19 Vaccination Clinic  Name:  Rebecca [name]    MRN: [id number] DOB: 10/27/1990  08/18/2019  [name] was observed post Covid-19 immunization for 30 minutes based on pre-vaccination screening without incident. She was provided with Vaccine Information Sheet and instruction to access the V-Safe system.   [name] was instructed to call 911 with any severe reactions post vaccine: Marland Kitchen Difficulty breathing  . Swelling of face and throat  . A fast heartbeat  . A bad rash all over body  . Dizziness and weakness   Immunizations Administered    Name Date Dose VIS Date Route   Pfizer COVID-19 Vaccine 08/18/2019  9:35 AM 0.3 mL 05/20/2019 Intramuscular   Manufacturer: [employer], [employer]   Lot: [id number]   NDC: [address]-2      Covid-19 Vaccination Clinic  Name:  Rebecca [name]    MRN: [id number] DOB: 1990/08/01  08/18/2019  [name] was observed post Covid-19 immunization for 30 minutes based on pre-vaccination screening without incident. She was provided with Vaccine Information Sheet and instruction to access the V-Safe system.   [name] was instructed to call 911 with any severe reactions post vaccine: Marland Kitchen Difficulty breathing  . Swelling of face and throat  . A fast heartbeat  . A bad rash all over body  . Dizziness and weakness   Immunizations Administered    Name Date Dose VIS Date Route   Pfizer COVID-19 Vaccine 08/18/2019  9:35 AM 0.3 mL 05/20/2019 Intramuscular   Manufacturer: [employer], [employer]   Lot: [id number]   NDC: [address]-1

## 2019-08-23 ENCOUNTER — Telehealth: Payer: Self-pay | Admitting: Obstetrics and Gynecology

## 2019-08-23 ENCOUNTER — Ambulatory Visit (INDEPENDENT_AMBULATORY_CARE_PROVIDER_SITE_OTHER): Payer: BC Managed Care – PPO

## 2019-08-23 ENCOUNTER — Encounter: Payer: Self-pay | Admitting: Obstetrics and Gynecology

## 2019-08-23 ENCOUNTER — Other Ambulatory Visit: Payer: Self-pay

## 2019-08-23 ENCOUNTER — Ambulatory Visit: Payer: BC Managed Care – PPO | Admitting: Obstetrics and Gynecology

## 2019-08-23 VITALS — BP 118/70 | HR 70 | Temp 98.1°F | Resp 14 | Ht 60.0 in | Wt 143.2 lb

## 2019-08-23 DIAGNOSIS — R102 Pelvic and perineal pain: Secondary | ICD-10-CM

## 2019-08-23 DIAGNOSIS — Z30431 Encounter for routine checking of intrauterine contraceptive device: Secondary | ICD-10-CM | POA: Diagnosis not present

## 2019-08-23 DIAGNOSIS — N83201 Unspecified ovarian cyst, right side: Secondary | ICD-10-CM

## 2019-08-23 NOTE — Telephone Encounter (Addendum)
Spoke with patient. Mirena IUD placed 08/03/19. Patient reports cramping daily and spotting since IUD was placed. Cramping became more intense last weekend, more at night. Reports bloating. Patient is concerned, requesting OV for IUD check. Denies fever/chills, N/V.   Recommended PUS, patient agreeable. Patient scheduled for PUS today at 2:30pm, consult to follow at 3pm with Dr. Oscar La. Order placed for precert, message to business office. Advised patient I will review with Dr. Oscar La and return call if any additional recommendations. Patient agreeable.   Covid 19 prescreen negative, precautions reviewed.   Routing to provider for final review. Patient is agreeable to disposition. Will close encounter.  Cc: Webb Silversmith, [place] P[place]

## 2019-08-23 NOTE — Telephone Encounter (Signed)
Patient is having a lot of cramping at night. She is still having bleeding after Mirena insertion.  She would like to come in this week.

## 2019-08-23 NOTE — Progress Notes (Signed)
GYNECOLOGY  VISIT   HPI: 29 y.o.   Single Other or two or more races Not Hispanic or Latino  female   G0P0000 with Patient's last menstrual period was 08/03/2019.   here for evaluation of pelvic pain.   The patient had a mirena IUD inserted on 08/03/19. Prior to IUD insertion she had bad cramps with her cycle. The week after the IUD insertion she continued to bleed and was getting intermittent cramps. Since then she has had daily spotting. Her cramps continued but got mild. Last week she had 2 nights of more severe cramps at night. The cramps are improving in the last few days.   GYNECOLOGIC HISTORY: Patient's last menstrual period was 08/03/2019. Contraception: mirena IUD  Menopausal hormone therapy: none        OB History    Gravida  0   Para  0   Term  0   Preterm  0   AB  0   Living  0     SAB  0   TAB  0   Ectopic  0   Multiple  0   Live Births                 Patient Active Problem List   Diagnosis Date Noted  . Migraine without aura   . Migraine headache without aura 11/03/2014    Past Medical History:  Diagnosis Date  . Amenorrhea   . Anxiety   . Migraine without aura   . Polycystic kidney disease     Past Surgical History:  Procedure Laterality Date  . WISDOM TOOTH EXTRACTION  2012/2013    Current Outpatient Medications  Medication Sig Dispense Refill  . albuterol (VENTOLIN HFA) 108 (90 Base) MCG/ACT inhaler Inhale 1 puff into the lungs 4 (four) times daily as needed.    . dicyclomine (BENTYL) 20 MG tablet Take 1 tablet (20 mg total) by mouth 3 (three) times daily before meals. As needed for abdominal cramping 20 tablet 0  . levonorgestrel (MIRENA) 20 MCG/24HR IUD 1 each by Intrauterine route once.     No current facility-administered medications for this visit.     ALLERGIES: Ibuprofen  Family History  Problem Relation Age of Onset  . Brain cancer Paternal Grandmother   . Diabetes Paternal Grandfather   . Heart attack Paternal  Grandfather   . Diabetes Paternal Uncle   . Heart attack Maternal Grandfather   . Hypertension Mother   . Polycystic kidney disease Mother   . Hypertension Father   . Hypertension Sister   . Breast cancer Sister 96       invasive ductal cancer, negative genetic testing    Social History   Socioeconomic History  . Marital status: Single    Spouse name: Not on file  . Number of children: Not on file  . Years of education: Not on file  . Highest education level: Not on file  Occupational History  . Not on file  Tobacco Use  . Smoking status: Never Smoker  . Smokeless tobacco: Never Used  Substance and Sexual Activity  . Alcohol use: No    Alcohol/week: 0.0 standard drinks  . Drug use: No  . Sexual activity: Yes    Partners: Male  Other Topics Concern  . Not on file  Social History Narrative  . Not on file   Social Determinants of Health   Financial Resource Strain:   . Difficulty of Paying Living Expenses:   Food Insecurity:   .  Worried About Charity fundraiser in the Last Year:   . Arboriculturist in the Last Year:   Transportation Needs:   . Film/video editor (Medical):   Marland Kitchen Lack of Transportation (Non-Medical):   Physical Activity:   . Days of Exercise per Week:   . Minutes of Exercise per Session:   Stress:   . Feeling of Stress :   Social Connections:   . Frequency of Communication with Friends and Family:   . Frequency of Social Gatherings with Friends and Family:   . Attends Religious Services:   . Active Member of Clubs or Organizations:   . Attends Archivist Meetings:   Marland Kitchen Marital Status:   Intimate Partner Violence:   . Fear of Current or Ex-Partner:   . Emotionally Abused:   Marland Kitchen Physically Abused:   . Sexually Abused:     Review of Systems  Constitutional: Negative.   HENT: Negative.   Eyes: Negative.   Respiratory: Negative.   Cardiovascular: Negative.   Gastrointestinal:       Bloating   Genitourinary:       Dysmenorrhea   Vaginal bleeding Tender breasts  Musculoskeletal: Negative.   Skin: Negative.   Neurological: Negative.   Endo/Heme/Allergies: Negative.   Psychiatric/Behavioral: Negative.     PHYSICAL EXAMINATION:    BP 118/70 (BP Location: Right Arm, Patient Position: Sitting, Cuff Size: Normal)   Pulse 70   Temp 98.1 F (36.7 C) (Skin)   Resp 14   Ht 5' (1.524 m)   Wt 143 lb 4 oz (65 kg)   LMP 08/03/2019   BMI 27.98 kg/m     General appearance: alert, cooperative and appears stated age Abdomen: soft, non-tender; non distended, no masses,  no organomegaly  Pelvic: External genitalia:  no lesions              Urethra:  normal appearing urethra with no masses, tenderness or lesions              Bartholins and Skenes: normal                 Vagina: normal appearing vagina with normal color and discharge, no lesions              Cervix: no cervical motion tenderness, no lesions and IUD string 3 cm              Bimanual Exam:  Uterus:  normal size, contour, position, consistency, mobility, non-tender and retroverted              Adnexa: fullness in the right adnexa, not tender                Chaperone was present for exam.  ASSESSMENT Pelvic cramps, normal exam other that fullness in the right adnexa Right ovarian cyst, tender during ultrasound Spotting, this is normal for the IUD  PLAN IUD in place Take tylenol for pain Call if symptoms don't continue to improve    An After Visit Summary was printed and given to the patient.  In addition to reviewing the ultrasound, ~ 10 minutes was spent in patient care, discussing expectations and management of her symptoms

## 2019-09-01 ENCOUNTER — Ambulatory Visit: Payer: BC Managed Care – PPO | Admitting: Obstetrics and Gynecology

## 2019-09-02 ENCOUNTER — Ambulatory Visit: Payer: BC Managed Care – PPO | Admitting: Obstetrics and Gynecology

## 2019-09-12 ENCOUNTER — Ambulatory Visit: Payer: BC Managed Care – PPO | Attending: Internal Medicine

## 2019-09-12 DIAGNOSIS — Z23 Encounter for immunization: Secondary | ICD-10-CM

## 2019-09-12 NOTE — Progress Notes (Signed)
   Covid-19 Vaccination Clinic  Name:  Rebecca [name]    MRN: [id number] DOB: [birth year]  09/12/2019  [name] was observed post Covid-19 immunization for 30 minutes based on pre-vaccination screening without incident. She was provided with Vaccine Information Sheet and instruction to access the V-Safe system.   [name] was instructed to call 911 with any severe reactions post vaccine: Marland Kitchen Difficulty breathing  . Swelling of face and throat  . A fast heartbeat  . A bad rash all over body  . Dizziness and weakness   Immunizations Administered    Name Date Dose VIS Date Route   Pfizer COVID-19 Vaccine 09/12/2019  4:18 PM 0.3 mL 05/20/2019 Intramuscular   Manufacturer: [employer], [employer]   Lot: [id number]   NDC: [address]-5

## 2020-02-24 ENCOUNTER — Other Ambulatory Visit: Payer: Self-pay | Admitting: Nephrology

## 2020-02-24 DIAGNOSIS — Q612 Polycystic kidney, adult type: Secondary | ICD-10-CM

## 2020-03-18 ENCOUNTER — Other Ambulatory Visit: Payer: BC Managed Care – PPO

## 2020-03-20 ENCOUNTER — Telehealth: Payer: Self-pay

## 2020-03-20 DIAGNOSIS — N926 Irregular menstruation, unspecified: Secondary | ICD-10-CM

## 2020-03-20 DIAGNOSIS — R102 Pelvic and perineal pain: Secondary | ICD-10-CM

## 2020-03-20 DIAGNOSIS — Z30431 Encounter for routine checking of intrauterine contraceptive device: Secondary | ICD-10-CM

## 2020-03-20 NOTE — Telephone Encounter (Signed)
Spoke with patient. Advised per Dr. Oscar La. Reviewed options for PUS at [facility] or outside imaging facility.  Patient request to keep OV as scheduled for 10/13, would like to return to [facility] next week for PUS. PUS scheduled for 03/29/20 at 4pm, consult to follow with Dr. Oscar La. Order placed for precert.   Routing to provider for final review. Patient is agreeable to disposition. Will close encounter.  Cc: Hayley Carder

## 2020-03-20 NOTE — Telephone Encounter (Signed)
Spoke with patient. Mirena IUD placed 08/03/19. Patient states the IUD helped with the intensity of cramps, but the frequency has increased. Reports nightly cramping for a couple of months now, 5/10 on pain scale, nausea with pain. LMP  03/06/20, still spotting. Denies pain at this time. Denies N/V, fever/chills, urinary symptoms.   PUS 3/16/2: Pelvic cramps, normal exam other that fullness in the right adnexa. Right ovarian cyst, tender during ultrasound.   She is going out of town on 03/22/20, returning on 10/17.   OV scheduled for 03/21/20 at 2pm with Dr. Oscar La. Advised patient I will update Dr. Oscar La, our office will return call if any additional recommendations. Patient agreeable.   Routing to Dr. Oscar La for final review.

## 2020-03-20 NOTE — Telephone Encounter (Signed)
Call placed to convey benefits for ultrasound. °

## 2020-03-20 NOTE — Telephone Encounter (Signed)
Patient is calling in regards to having "bad cramps and longer cycles with IUD".

## 2020-03-20 NOTE — Telephone Encounter (Signed)
I would recommend adding an ultrasound if possible.

## 2020-03-21 ENCOUNTER — Ambulatory Visit: Payer: BC Managed Care – PPO | Admitting: Obstetrics and Gynecology

## 2020-03-21 ENCOUNTER — Other Ambulatory Visit: Payer: Self-pay

## 2020-03-21 ENCOUNTER — Encounter: Payer: Self-pay | Admitting: Obstetrics and Gynecology

## 2020-03-21 VITALS — BP 130/72 | HR 84 | Ht 60.0 in | Wt 151.6 lb

## 2020-03-21 DIAGNOSIS — R102 Pelvic and perineal pain: Secondary | ICD-10-CM

## 2020-03-21 DIAGNOSIS — Z975 Presence of (intrauterine) contraceptive device: Secondary | ICD-10-CM | POA: Diagnosis not present

## 2020-03-21 DIAGNOSIS — N921 Excessive and frequent menstruation with irregular cycle: Secondary | ICD-10-CM | POA: Diagnosis not present

## 2020-03-21 DIAGNOSIS — N949 Unspecified condition associated with female genital organs and menstrual cycle: Secondary | ICD-10-CM

## 2020-03-21 MED ORDER — NORETHIN ACE-ETH ESTRAD-FE 1-20 MG-MCG PO TABS
1.0000 | ORAL_TABLET | Freq: Every day | ORAL | 0 refills | Status: DC
Start: 2020-03-21 — End: 2020-07-19

## 2020-03-21 NOTE — Progress Notes (Signed)
GYNECOLOGY  VISIT   HPI: 29 y.o.   Single Other or two or more races Not Hispanic or Latino  female   G0P0000 with No LMP recorded. (Menstrual status: IUD).   here for cramping and breakthrough bleeding with IUD.   She states that she had a week long period where she uses a lite tampon but after that week she has spotting for about 2 weeks. She is getting cramps almost every day. She states that on a pain scale it is a 5 out of 10.  She has a mirena, placed in 08/03/19, prior to IUD insertion she had severe cramps. Ultrasound in 3/21 IUD was in place. She had a 3.3 cm right ovarian cyst at that time. After that visit her cramps got better.  She is having a cycle monthly x 7 days, light. Then spots for 2 weeks after (for the last 3 months). Some intermittent menstrual cramps, better than prior to the IUD, but she is having frequent cramps throughout the month. The cramps throughout the month started 3-4 months ago. Pain can be a 5/10 in severity. Tylenol helps (can't take ibuprofen). Sexually active, same partner x 1 year. Not using condoms. No dyspareunia.  She had a negative home pregnancy test a few days ago.   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   GYNECOLOGIC HISTORY: No LMP recorded. (Menstrual status: IUD). Contraception:IUD Menopausal hormone therapy: none         OB History    Gravida  0   Para  0   Term  0   Preterm  0   AB  0   Living  0     SAB  0   TAB  0   Ectopic  0   Multiple  0   Live Births                 Patient Active Problem List   Diagnosis Date Noted   Migraine without aura    Migraine headache without aura 11/03/2014    Past Medical History:  Diagnosis Date   Amenorrhea    Anxiety    Migraine without aura    Polycystic kidney disease     Past Surgical History:  Procedure Laterality Date   WISDOM TOOTH EXTRACTION  2012/2013    Current Outpatient Medications  Medication Sig Dispense Refill   albuterol (VENTOLIN  HFA) 108 (90 Base) MCG/ACT inhaler Inhale 1 puff into the lungs 4 (four) times daily as needed.     dicyclomine (BENTYL) 20 MG tablet Take 1 tablet (20 mg total) by mouth 3 (three) times daily before meals. As needed for abdominal cramping 20 tablet 0   levonorgestrel (MIRENA) 20 MCG/24HR IUD 1 each by Intrauterine route once.     No current facility-administered medications for this visit.     ALLERGIES: Ibuprofen  Family History  Problem Relation Age of Onset   Brain cancer Paternal Grandmother    Diabetes Paternal Grandfather    Heart attack Paternal Grandfather    Diabetes Paternal Uncle    Heart attack Maternal Grandfather    Hypertension Mother    Polycystic kidney disease Mother    Hypertension Father    Hypertension Sister    Breast cancer Sister 29       invasive ductal cancer, negative genetic testing    Social History   Socioeconomic History   Marital status: Single    Spouse name: Not on file   Number of children: Not on  file   Years of education: Not on file   Highest education level: Not on file  Occupational History   Not on file  Tobacco Use   Smoking status: Never Smoker   Smokeless tobacco: Never Used  Vaping Use   Vaping Use: Never used  Substance and Sexual Activity   Alcohol use: No    Alcohol/week: 0.0 standard drinks   Drug use: No   Sexual activity: Yes    Partners: Male  Other Topics Concern   Not on file  Social History Narrative   Not on file   Social Determinants of Health   Financial Resource Strain:    Difficulty of Paying Living Expenses: Not on file  Food Insecurity:    Worried About Running Out of Food in the Last Year: Not on file   Ran Out of Food in the Last Year: Not on file  Transportation Needs:    Lack of Transportation (Medical): Not on file   Lack of Transportation (Non-Medical): Not on file  Physical Activity:    Days of Exercise per Week: Not on file   Minutes of Exercise per  Session: Not on file  Stress:    Feeling of Stress : Not on file  Social Connections:    Frequency of Communication with Friends and Family: Not on file   Frequency of Social Gatherings with Friends and Family: Not on file   Attends Religious Services: Not on file   Active Member of Clubs or Organizations: Not on file   Attends Banker Meetings: Not on file   Marital Status: Not on file  Intimate Partner Violence:    Fear of Current or Ex-Partner: Not on file   Emotionally Abused: Not on file   Physically Abused: Not on file   Sexually Abused: Not on file    Review of Systems  Genitourinary:       Vaginal bleeding Cramping    All other systems reviewed and are negative.   PHYSICAL EXAMINATION:    There were no vitals taken for this visit.    General appearance: alert, cooperative and appears stated age Neck: no adenopathy, supple, symmetrical, trachea midline and thyroid normal to inspection and palpation Abdomen: soft, non-tender; non distended, no masses,  no organomegaly  Pelvic: External genitalia:  no lesions              Urethra:  normal appearing urethra with no masses, tenderness or lesions              Bartholins and Skenes: normal                 Vagina: normal appearing vagina with normal color and discharge, no lesions              Cervix: no lesions and IUD string 3 cm              Bimanual Exam:  Uterus:  normal size, contour, position, consistency, mobility, non-tender and retroverted              Adnexa: fullness in the right adnexa, mildly tender.                 Chaperone was present for exam.  ASSESSMENT BTB with the IUD Pelvic cramping Fullness in right adnexa    PLAN Recent negative UPT Nuswab for GC/CT, declines other testing Return for pelvic ultrasound Start OCP's for 3 months, no contraindications, risks reviewed.

## 2020-03-21 NOTE — Patient Instructions (Signed)

## 2020-03-21 NOTE — Telephone Encounter (Signed)
Spoke with patient regarding benefits for scheduled Pelvic ultrasound. Patient acknowledges understanding of information presented. Encounter closed. 

## 2020-03-22 ENCOUNTER — Other Ambulatory Visit: Payer: Self-pay | Admitting: Nephrology

## 2020-03-22 DIAGNOSIS — Q612 Polycystic kidney, adult type: Secondary | ICD-10-CM

## 2020-03-22 LAB — GC/CHLAMYDIA PROBE AMP
Chlamydia trachomatis, NAA: NEGATIVE
Neisseria Gonorrhoeae by PCR: NEGATIVE

## 2020-03-29 ENCOUNTER — Other Ambulatory Visit: Payer: Self-pay

## 2020-03-29 ENCOUNTER — Encounter: Payer: Self-pay | Admitting: Obstetrics and Gynecology

## 2020-03-29 ENCOUNTER — Ambulatory Visit (INDEPENDENT_AMBULATORY_CARE_PROVIDER_SITE_OTHER): Payer: BC Managed Care – PPO

## 2020-03-29 ENCOUNTER — Ambulatory Visit: Payer: BC Managed Care – PPO | Admitting: Obstetrics and Gynecology

## 2020-03-29 ENCOUNTER — Ambulatory Visit
Admission: RE | Admit: 2020-03-29 | Discharge: 2020-03-29 | Disposition: A | Discharge status: Home / Self Care | Payer: BC Managed Care – PPO | Source: Ambulatory Visit | Attending: Nephrology | Admitting: Nephrology

## 2020-03-29 VITALS — BP 110/64 | HR 78 | Ht 60.0 in | Wt 151.0 lb

## 2020-03-29 DIAGNOSIS — Z975 Presence of (intrauterine) contraceptive device: Secondary | ICD-10-CM

## 2020-03-29 DIAGNOSIS — R102 Pelvic and perineal pain: Secondary | ICD-10-CM

## 2020-03-29 DIAGNOSIS — N921 Excessive and frequent menstruation with irregular cycle: Secondary | ICD-10-CM

## 2020-03-29 DIAGNOSIS — Q612 Polycystic kidney, adult type: Secondary | ICD-10-CM

## 2020-03-29 DIAGNOSIS — N926 Irregular menstruation, unspecified: Secondary | ICD-10-CM

## 2020-03-29 DIAGNOSIS — Z30431 Encounter for routine checking of intrauterine contraceptive device: Secondary | ICD-10-CM

## 2020-03-29 NOTE — Progress Notes (Signed)
GYNECOLOGY  VISIT   HPI: 29 y.o.   Single Other or two or more races Not Hispanic or Latino  female   G0P0000 with Patient's last menstrual period was 03/06/2020.   here for ultrasound consult for pelvic cramping and BTB on OCP's.  She had a mirena IUD placed in 2/21, prior to IUD insertion she had severe cramps with her cycle. With the IUD her cramps aren't as severe but are coming frequently throughout the month. At her visit last week, she was given a script for OCP's to take for 3 months. She hasn't started OCP's yet, she is waiting for her cycle to start.    GYNECOLOGIC HISTORY: Patient's last menstrual period was 03/06/2020. Contraception IUD  Menopausal hormone therapy: none         OB History    Gravida  0   Para  0   Term  0   Preterm  0   AB  0   Living  0     SAB  0   TAB  0   Ectopic  0   Multiple  0   Live Births                 Patient Active Problem List   Diagnosis Date Noted  . Migraine without aura   . Migraine headache without aura 11/03/2014    Past Medical History:  Diagnosis Date  . Amenorrhea   . Anxiety   . Migraine without aura   . Polycystic kidney disease     Past Surgical History:  Procedure Laterality Date  . WISDOM TOOTH EXTRACTION  2012/2013    Current Outpatient Medications  Medication Sig Dispense Refill  . albuterol (VENTOLIN HFA) 108 (90 Base) MCG/ACT inhaler Inhale 1 puff into the lungs 4 (four) times daily as needed.    . dicyclomine (BENTYL) 20 MG tablet Take 1 tablet (20 mg total) by mouth 3 (three) times daily before meals. As needed for abdominal cramping 20 tablet 0  . levonorgestrel (MIRENA) 20 MCG/24HR IUD 1 each by Intrauterine route once.    . norethindrone-ethinyl estradiol (LOESTRIN FE) 1-20 MG-MCG tablet Take 1 tablet by mouth daily. 84 tablet 0   No current facility-administered medications for this visit.     ALLERGIES: Ibuprofen  Family History  Problem Relation Age of Onset  . Brain cancer  Paternal Grandmother   . Diabetes Paternal Grandfather   . Heart attack Paternal Grandfather   . Diabetes Paternal Uncle   . Heart attack Maternal Grandfather   . Hypertension Mother   . Polycystic kidney disease Mother   . Hypertension Father   . Hypertension Sister   . Breast cancer Sister 55       invasive ductal cancer, negative genetic testing    Social History   Socioeconomic History  . Marital status: Single    Spouse name: Not on file  . Number of children: Not on file  . Years of education: Not on file  . Highest education level: Not on file  Occupational History  . Not on file  Tobacco Use  . Smoking status: Never Smoker  . Smokeless tobacco: Never Used  Vaping Use  . Vaping Use: Never used  Substance and Sexual Activity  . Alcohol use: No    Alcohol/week: 0.0 standard drinks  . Drug use: No  . Sexual activity: Yes    Partners: Male  Other Topics Concern  . Not on file  Social History Narrative  .  Not on file   Social Determinants of Health   Financial Resource Strain:   . Difficulty of Paying Living Expenses: Not on file  Food Insecurity:   . Worried About Programme researcher, broadcasting/film/video in the Last Year: Not on file  . Ran Out of Food in the Last Year: Not on file  Transportation Needs:   . Lack of Transportation (Medical): Not on file  . Lack of Transportation (Non-Medical): Not on file  Physical Activity:   . Days of Exercise per Week: Not on file  . Minutes of Exercise per Session: Not on file  Stress:   . Feeling of Stress : Not on file  Social Connections:   . Frequency of Communication with Friends and Family: Not on file  . Frequency of Social Gatherings with Friends and Family: Not on file  . Attends Religious Services: Not on file  . Active Member of Clubs or Organizations: Not on file  . Attends Banker Meetings: Not on file  . Marital Status: Not on file  Intimate Partner Violence:   . Fear of Current or Ex-Partner: Not on file  .  Emotionally Abused: Not on file  . Physically Abused: Not on file  . Sexually Abused: Not on file    Review of Systems  All other systems reviewed and are negative.   PHYSICAL EXAMINATION:    BP 110/64   Pulse 78   Ht 5' (1.524 m)   Wt 151 lb (68.5 kg)   LMP 03/06/2020   SpO2 100%   BMI 29.49 kg/m     General appearance: alert, cooperative and appears stated age  Ultrasound images reviewed with the patient. Normal ultrasound, IUD in place.  ASSESSMENT BTB and cramping with mirena IUD H/O severe dysmenorrhea. Normal ultrasound    PLAN She will do a trial of OCP's for 3 months to see if it helps. F/U in 3 months.

## 2020-03-30 ENCOUNTER — Encounter: Payer: Self-pay | Admitting: Obstetrics and Gynecology

## 2020-04-16 ENCOUNTER — Telehealth: Payer: Self-pay

## 2020-04-16 NOTE — Telephone Encounter (Signed)
Patient is returning call.  °

## 2020-04-16 NOTE — Telephone Encounter (Signed)
Left message for pt to return call to triage RN. 

## 2020-04-16 NOTE — Telephone Encounter (Signed)
Patient has a question about her birth control. °

## 2020-04-16 NOTE — Telephone Encounter (Signed)
OV 03/29/20-BTB OV 03/21/20-BTB  Spoke with pt. Pt calling to give update to Dr Oscar La. Pt states stopped BTB and abd cramping on 04/05/20 and was suppose to start OCP Rx that she was given at start of next cycle. Pt states has not started next cycle. Denies any vaginal bleeding, cramps and also took UPT that was negative.  Pt has Mirena IUD that was placed in 07/2019.  Pt asking if needs to start OCPs anyway to help regulate cycles again or monitor at this time.   Advised will review with Dr Oscar La and return call. Pt agreeable.   Routing to Dr Oscar La.

## 2020-04-17 NOTE — Telephone Encounter (Signed)
Spoke with patient and reviewed Dr.Jertson's recommendations below. Patient voiced understanding.

## 2020-04-17 NOTE — Telephone Encounter (Signed)
It is up to her, she can wait and see what her next cycle is like. If it is prolonged she can just start the pill mid cycle. It may increase her spotting for the first month, but then it should help. If her next cycle is fine, she can hold off on OCP's.

## 2020-06-20 ENCOUNTER — Telehealth: Payer: Self-pay | Admitting: Obstetrics and Gynecology

## 2020-06-20 NOTE — Telephone Encounter (Signed)
Patient canceled her appointment for follow up start OCP with IUD. Patient did not wish to reschedule stating she will follow up at her [facility] 07/20/19.

## 2020-07-04 ENCOUNTER — Ambulatory Visit: Payer: Self-pay | Admitting: Obstetrics and Gynecology

## 2020-07-19 ENCOUNTER — Encounter: Payer: Self-pay | Admitting: Obstetrics and Gynecology

## 2020-07-19 ENCOUNTER — Ambulatory Visit: Payer: BC Managed Care – PPO | Admitting: Obstetrics and Gynecology

## 2020-07-19 ENCOUNTER — Other Ambulatory Visit: Payer: Self-pay

## 2020-07-19 VITALS — BP 132/64 | HR 98 | Ht 60.0 in | Wt 154.0 lb

## 2020-07-19 DIAGNOSIS — Z3009 Encounter for other general counseling and advice on contraception: Secondary | ICD-10-CM

## 2020-07-19 DIAGNOSIS — Z01419 Encounter for gynecological examination (general) (routine) without abnormal findings: Secondary | ICD-10-CM | POA: Diagnosis not present

## 2020-07-19 DIAGNOSIS — Z803 Family history of malignant neoplasm of breast: Secondary | ICD-10-CM | POA: Diagnosis not present

## 2020-07-19 DIAGNOSIS — R635 Abnormal weight gain: Secondary | ICD-10-CM

## 2020-07-19 DIAGNOSIS — Z30431 Encounter for routine checking of intrauterine contraceptive device: Secondary | ICD-10-CM | POA: Diagnosis not present

## 2020-07-19 LAB — TSH: TSH: 1.16 mIU/L

## 2020-07-19 NOTE — Patient Instructions (Signed)

## 2020-07-19 NOTE — Progress Notes (Signed)
30 y.o. G0P0000 Single Other or two or more races Not Hispanic or Latino female here for annual exam. She had a mirena IUD inserted in 2/21.  Patient feels like she is getting more cyst with her IUD, given intermittent, random pelvic pain. She feels like she has gained weight with the IUD and has more mood swings.  Her cycles are much better with the IUD.  Got engaged, getting married in 06/08/21. Plans to get pregnant in 1.5 years.  Cycles have gotten more regular, bleeding ~q 30 days, only spots but it lasts 9 days.  Period Cycle (Days): 30 Period Duration (Days): 9 Period Pattern: (!) Irregular Menstrual Flow: Light Menstrual Control: Panty liner,Tampon Menstrual Control Change Freq (Hours): 6 Dysmenorrhea: (!) Moderate Dysmenorrhea Symptoms: Cramping   No dyspareunia.  She has a h/o polycystic kidneys, had blood work there at the end of last year.   Patient's last menstrual period was 06/25/2020.          Sexually active: Yes.    The current method of family planning is IUD.    Exercising: No.  The patient does not participate in regular exercise at present. Smoker:  no  Health Maintenance: Pap:  07/21/19 WNL 11/03/14 WNL Hr Hpv Neg  History of abnormal Pap:  no MMG:  None  BMD:   None  Colonoscopy: none  TDaP:  08/18/19  Gardasil: Complete    reports that she has never smoked. She has never used smokeless tobacco. She reports that she does not drink alcohol and does not use drugs. 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  Past Medical History:  Diagnosis Date  . Amenorrhea   . Anxiety   . Migraine without aura   . Polycystic kidney disease     Past Surgical History:  Procedure Laterality Date  . WISDOM TOOTH EXTRACTION  2012/2013    Current Outpatient Medications  Medication Sig Dispense Refill  . albuterol (VENTOLIN HFA) 108 (90 Base) MCG/ACT inhaler Inhale 1 puff into the lungs 4 (four) times daily as needed.    . dicyclomine (BENTYL) 20 MG tablet Take 1 tablet (20 mg  total) by mouth 3 (three) times daily before meals. As needed for abdominal cramping 20 tablet 0  . levonorgestrel (MIRENA) 20 MCG/24HR IUD 1 each by Intrauterine route once.    Marland Kitchen lisinopril (ZESTRIL) 5 MG tablet Take 5 mg by mouth daily.     No current facility-administered medications for this visit.    Family History  Problem Relation Age of Onset  . Brain cancer Paternal Grandmother   . Diabetes Paternal Grandfather   . Heart attack Paternal Grandfather   . Diabetes Paternal Uncle   . Heart attack Maternal Grandfather   . Hypertension Mother   . Polycystic kidney disease Mother   . Hypertension Father   . Hypertension Sister   . Breast cancer Sister 55       invasive ductal cancer, negative genetic testing    Review of Systems  All other systems reviewed and are negative.   Exam:   BP 132/64   Pulse 98   Ht 5' (1.524 m)   Wt 154 lb (69.9 kg)   LMP 06/25/2020   SpO2 99%   BMI 30.08 kg/m   Weight change: @WEIGHTCHANGE @ Height:   Height: 5' (152.4 cm)  Ht Readings from Last 3 Encounters:  07/19/20 5' (1.524 m)  03/29/20 5' (1.524 m)  03/21/20 5' (1.524 m)    General appearance: alert, cooperative and appears  stated age Head: Normocephalic, without obvious abnormality, atraumatic Neck: no adenopathy, supple, symmetrical, trachea midline and thyroid normal to inspection and palpation Lungs: clear to auscultation bilaterally Cardiovascular: regular rate and rhythm Breasts: normal appearance, no masses or tenderness Abdomen: soft, non-tender; non distended,  no masses,  no organomegaly Extremities: extremities normal, atraumatic, no cyanosis or edema Skin: Skin color, texture, turgor normal. No rashes or lesions Lymph nodes: Cervical, supraclavicular, and axillary nodes normal. No abnormal inguinal nodes palpated Neurologic: Grossly normal   Pelvic: External genitalia:  no lesions              Urethra:  normal appearing urethra with no masses, tenderness or  lesions              Bartholins and Skenes: normal                 Vagina: normal appearing vagina with normal color and discharge, no lesions              Cervix: no lesions and iud string 2 cm               Bimanual Exam:  Uterus:  normal size, contour, position, consistency, mobility, non-tender and retroverted              Adnexa: no mass, fullness, tenderness               Rectovaginal: Confirms               Anus:  normal sphincter tone, no lesions  Claudette Laws chaperoned for the exam.  1. Well woman exam Discussed breast self exam Discussed calcium and vit D intake No pap this year  2. IUD check up Some side effects, some benefits, wants to continue for now  3. Family history of breast cancer  - Ambulatory referral to Genetics  4. Weight gain  - TSH  5. General counseling and advice on female contraception Discussed option of OCP's, she has a sample at home.

## 2020-07-20 ENCOUNTER — Telehealth: Payer: Self-pay | Admitting: Genetic Counselor

## 2020-07-20 NOTE — Telephone Encounter (Signed)
Received a genetic counseling referral from Dr. Jertson for fhx of brca. Rebecca [name] has been cld and scheduled for a mychart video visit on 2/17 at 10am. °

## 2020-07-26 ENCOUNTER — Encounter: Payer: Self-pay | Admitting: Genetic Counselor

## 2020-07-26 ENCOUNTER — Inpatient Hospital Stay: Payer: BC Managed Care – PPO | Attending: Genetic Counselor | Admitting: Genetic Counselor

## 2020-07-26 DIAGNOSIS — Z8042 Family history of malignant neoplasm of prostate: Secondary | ICD-10-CM

## 2020-07-26 DIAGNOSIS — Z800 Family history of malignant neoplasm of digestive organs: Secondary | ICD-10-CM

## 2020-07-26 DIAGNOSIS — Z808 Family history of malignant neoplasm of other organs or systems: Secondary | ICD-10-CM

## 2020-07-26 DIAGNOSIS — Z803 Family history of malignant neoplasm of breast: Secondary | ICD-10-CM

## 2020-07-26 HISTORY — DX: Family history of malignant neoplasm of breast: Z80.3

## 2020-07-26 NOTE — Progress Notes (Signed)
REFERRING PROVIDER: Salvadore Dom, [address],  [place] [address]  PRIMARY PROVIDER:  Leeroy Cha, MD  PRIMARY REASON FOR VISIT:  1. Family history of breast cancer   2. Family history of prostate cancer   3. Family history of brain cancer   4. Family history of stomach cancer    I connected with [name] on 07/26/2020 at [place] EDT by Jermyn video conference and verified that I am speaking with the correct person using two identifiers.   Patient location: Office Provider location: [place]:   [name], a 31 y.o. female, was seen for a [place] cancer genetics consultation at the request of Dr. Talbert [name] due to a family history of cancer.  [name] presents to clinic today to discuss the possibility of a hereditary predisposition to cancer, to discuss genetic testing, and to further clarify her future cancer risks, as well as potential cancer risks for family members.   [name] is a 30 y.o. female with no personal history of cancer.    CANCER HISTORY:  Oncology History   No history exists.    RISK FACTORS:  Menarche was at age 43.  Nulliparous.  OCP use for approximately 0 years.  Ovaries intact: yes.  Hysterectomy: no.  Menopausal status: premenopausal.  HRT use: 0 years. Colonoscopy: no; not examined. Mammogram within the last year: no. Number of breast biopsies: 0. Up to date with pelvic exams: yes. Other cancer screening: dermatology annually Any excessive radiation exposure in the past: no  Past Medical History:  Diagnosis Date  . Amenorrhea   . Anxiety   . Family history of breast cancer 07/26/2020  . Migraine without aura   . Polycystic kidney disease     Past Surgical History:  Procedure Laterality Date  . WISDOM TOOTH EXTRACTION  2012/2013    Social History   Socioeconomic History  . Marital status: Single    Spouse name: Not on file  . Number of children: Not on file   . Years of education: Not on file  . Highest education level: Not on file  Occupational History  . Not on file  Tobacco Use  . Smoking status: Never Smoker  . Smokeless tobacco: Never Used  Vaping Use  . Vaping Use: Never used  Substance and Sexual Activity  . Alcohol use: Approximately 1-2 drinks/month    Alcohol/week:   . Drug use: No  . Sexual activity: Yes    Partners: Male  Other Topics Concern  . Not on file  Social History Narrative  . Not on file   Social Determinants of Health   Financial Resource Strain: Not on file  Food Insecurity: Not on file  Transportation Needs: Not on file  Physical Activity: Not on file  Stress: Not on file  Social Connections: Not on file     FAMILY HISTORY:  We obtained a detailed, 4-generation family history.  Significant diagnoses are listed below: Family History  Problem Relation Age of Onset  . Brain cancer Paternal Grandmother 71  . Stomach cancer Paternal Grandfather 13  . Breast cancer Sister 46       invasive ductal cancer, negative genetic testing  . Breast cancer Other 53       MGM's sister  . Prostate cancer Other        MGM's brother    [name] has one sister, age 79, who was diagnosed with invasive ductal carcinoma of  the left breast at age 61.  She had negative genetic testing for 60 genes through [facility] in February 2021.  Ms. [name] mother is living at age 80 and has not had cancer.  Ms. [name] maternal grandmother's sister had a history of breast cancer at age 47 and brother had a history of prostate cancer.  No other maternal family history of cancer was reported.   Ms. [name] father is living at age 28 and has not had cancer.  Ms. [name] paternal grandmother had brain cancer diagnosed at age 77 and paternal grandfather had stomach cancer diagnosed in his 46s.  Ms. [name] paternal grandmother's brother also had cancer of an unknown type.  No other paternal family history of cancer was reported.    Ms. [name] is unaware of previous family history of genetic testing for hereditary cancer risks besides that mentioned above. Patient's maternal ancestors are of [place], [place], and [place] descent, and paternal ancestors are of [place] and [place] descent. There is no reported Ashkenazi Jewish ancestry. There is no known consanguinity.  GENETIC COUNSELING ASSESSMENT: Ms. [name] is a 30 y.o. female with a family history of cancer which is suggestive of a predisposition to cancer given the presence of breast cancer at a young age. We, therefore, discussed and recommended the following at today's visit.   DISCUSSION:   We discussed that, in general, most cancer is not inherited in families, but instead is sporadic or familial. Sporadic cancers occur by chance and typically happen at older ages (>50 years) as this type of cancer is caused by genetic changes acquired during an individual's lifetime. Some families have more cancers than would be expected by chance; however, the ages or types of cancer are not consistent with a known genetic mutation or known genetic mutations have been ruled out. This type of familial cancer is thought to be due to a combination of multiple genetic, environmental, hormonal, and lifestyle factors. While this combination of factors likely increases the risk of cancer, the exact source of this risk is not currently identifiable or testable.     We discussed that approximately 5-10% of  cancer is hereditary, meaning that it is due to a mutation in a single gene that is passed down from generation to generation in a family. Most hereditary cases of breast cancer are associated with mutations in BRCA1/2. There are other genes that can be associated an increased risk for breast cancer.  Type of cancer risk and level of risk are gene-specific. We discussed that testing can be beneficial for several reasons, including knowing about other cancer risks, identifying potential screening and  risk-reduction options that may be appropriate, and to understand if other family members could be at risk for cancer and allow them to undergo genetic testing.  We discussed with Ms. [name] that given her sister had negative genetic testing, the family history does not meet insurance or NCCN criteria for genetic testing and, therefore, is not highly consistent with an identifiable familial hereditary cancer syndrome. Thus, we did not recommend any genetic testing, at this time, and recommended Ms. [name] continue to follow the cancer screening guidelines given by her primary healthcare provider.  We discussed that her sister's negative genetic testing does not rule out the presence of an unidentifiable hereditary cancer syndrome.  Thus, genetic testing may be appropriate for this family in the future as the field of cancer genetics develops.    Based on the patient's family history, a statistical model Scientist, research (medical)) was used to  estimate her risk of developing breast cancer. This estimates her lifetime risk of developing breast cancer to be approximately 22%. The patient's lifetime breast cancer risk is a preliminary estimate based on available information using one of several models endorsed by the [place] (ACS).  [place] (NCCN) recommends individuals with estimated lifetime breast cancer risks of 20 percent or higher begin mammograms 10 years prior to the youngest breast cancer diagnosis in the family (not prior to age 54) and to begin breast MRIs 10 years prior to the youngest breast cancer diagnosis in the family (not prior to age 35).       [name] has been determined to be at high risk for breast cancer.  Therefore, we recommend that she consider beginning breast MRIs in the near future and consider beginning mammograms at age 45.We discussed that [name] should discuss her individual situation with her referring physician and determine a breast  cancer screening plan with which they are both comfortable.  [name] was offered a referral to the high risk breast cancer clinic.  She will consider this option and will reach out if she is interested.     PLAN: [name] is not pursuing genetic testing today. She agreed to speak with Dr. [name] Chimes about the breast screening recommendations listed above. We encouraged [name] to remain in contact with cancer genetics regularly so that we can continuously update the family history and inform her of any changes in cancer genetics and testing that may be of benefit for this family.   Ms. Derry Skill questions were answered to her satisfaction today. Our contact information was provided should additional questions or concerns arise. Thank you for the referral and allowing [place] to share in the care of this patient.   Marcheta Horsey M. Joette Catching, [place], [facility] Genetic Counselor [email or web address] (P) [phone]   The patient was seen for a total of 20 minutes in face-to-face genetic counseling.  Drs. Magrinat, Lindi Adie and/or Burr Medico were available to discuss this case as needed.  _______________________________________________________________________ For Office Staff:  Number of people involved in session: 1 Was an Intern/ student involved with case: no

## 2020-09-19 ENCOUNTER — Telehealth: Payer: Self-pay | Admitting: Obstetrics and Gynecology

## 2020-09-19 NOTE — Telephone Encounter (Signed)
Left message for patient to call.

## 2020-09-19 NOTE — Telephone Encounter (Signed)
Mammograms are harder to interpret on young women. The recommendation for her is to start mammograms at 31. The MRI's can find things the mammogram can't. I would recommend the MRI now, if she is able to do that. We can always send her for a consultation with a breast specialist at the high risk breast clinic if she would like.

## 2020-09-19 NOTE — Telephone Encounter (Signed)
When the patient saw the genetic counselor they estimated her lifetime risk of breast cancer to be 22%.  The recommendation is to start MRI's now and add mammograms when she is 31. Please call her discuss ordering the MRI with her.  Thanks

## 2020-09-19 NOTE — Telephone Encounter (Signed)
Patient informed with below note, she would rather hold off on Mri's for right now. She would prefer to have mammogram done first before MRI. She asked if you thought okay to wait until 31 for mammogram screening or can she have it now at 61? Please advise

## 2020-09-20 NOTE — Telephone Encounter (Signed)
Patient informed with below note, patient prefers to hold off on all the below for now.

## 2020-12-18 ENCOUNTER — Ambulatory Visit
Admission: RE | Admit: 2020-12-18 | Discharge: 2020-12-18 | Disposition: A | Discharge status: Home / Self Care | Payer: BC Managed Care – PPO | Source: Ambulatory Visit | Attending: Internal Medicine | Admitting: Internal Medicine

## 2020-12-18 ENCOUNTER — Other Ambulatory Visit: Payer: Self-pay | Admitting: Internal Medicine

## 2020-12-18 ENCOUNTER — Other Ambulatory Visit: Payer: Self-pay

## 2020-12-18 DIAGNOSIS — M542 Cervicalgia: Secondary | ICD-10-CM

## 2020-12-27 ENCOUNTER — Ambulatory Visit: Payer: BC Managed Care – PPO | Admitting: Rehabilitative and Restorative Service Providers"

## 2021-02-08 IMAGING — US US RENAL
1 series · 13 of 25 positions shown · non-contrast
Comparison: 07/15/2018

CLINICAL DATA: Autosomal dominant polycystic kidney disease

EXAM:
RENAL / URINARY TRACT ULTRASOUND COMPLETE

[Series 1: us renal · 0.19mm/px · 13 of 48 slices shown]
[im 1/48]
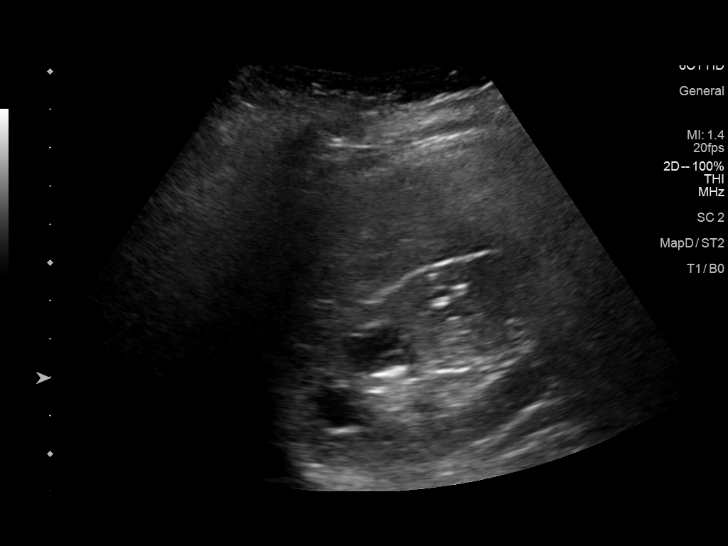
[im 4/48]
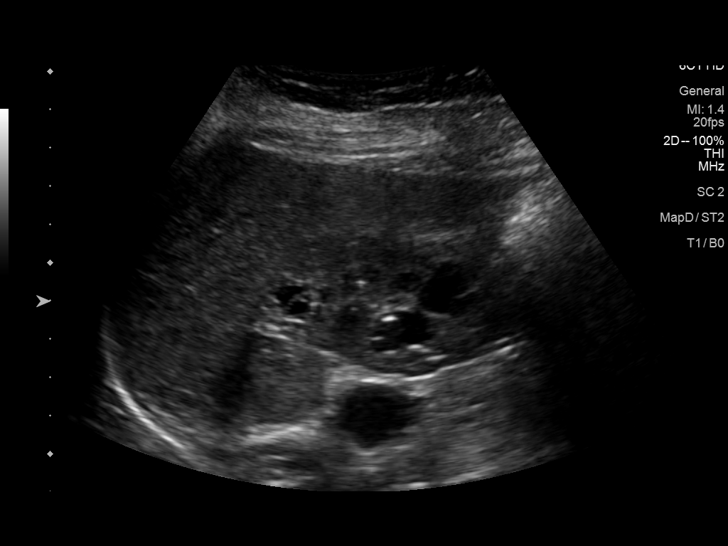
[im 8/48]
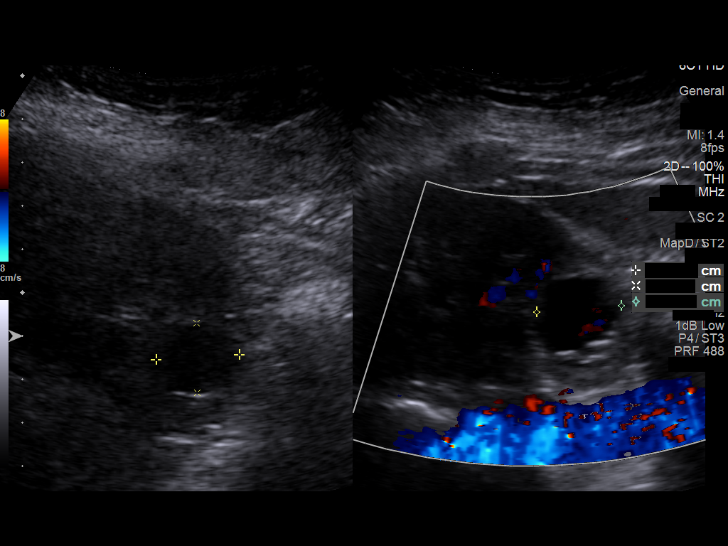
[im 12/48]
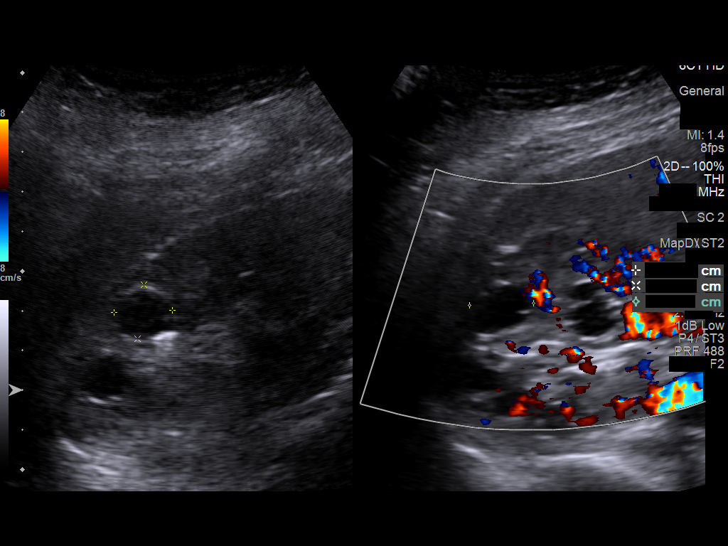
[im 16/48]
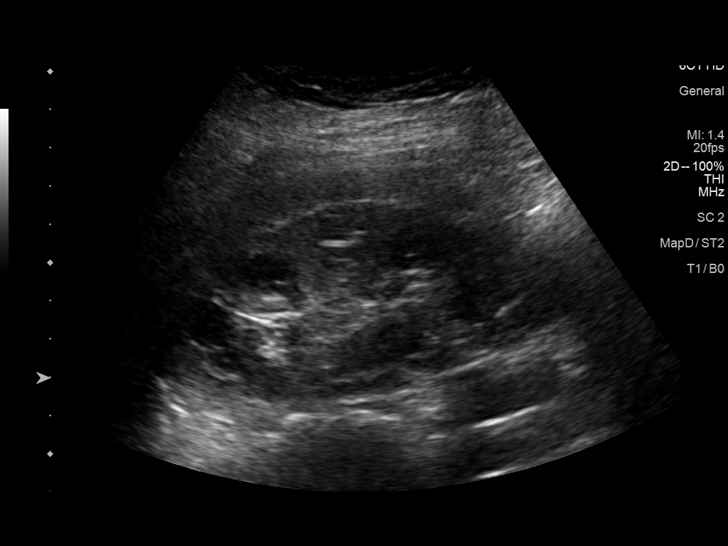
[im 20/48]
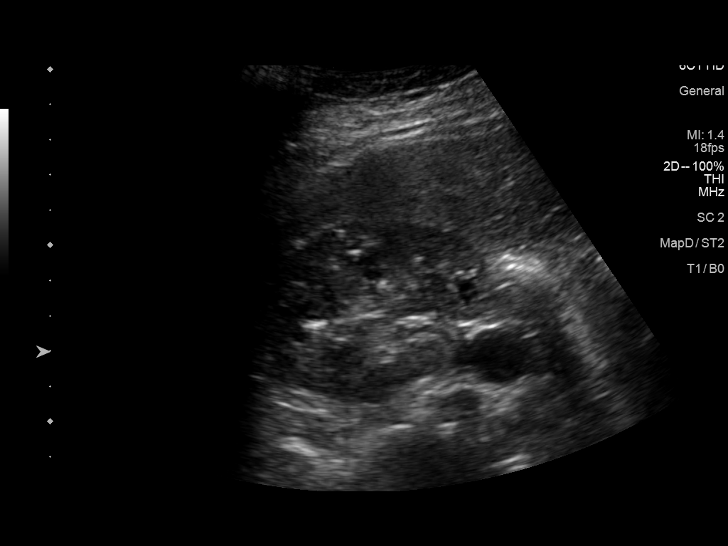
[im 24/48]
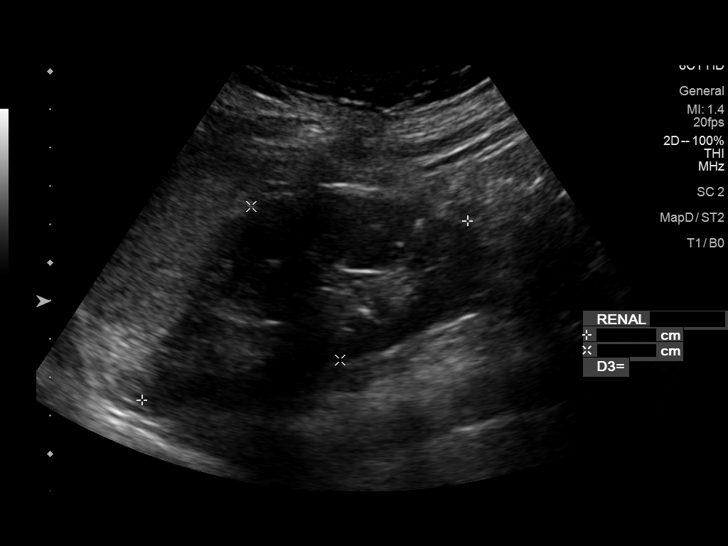
[im 28/48]
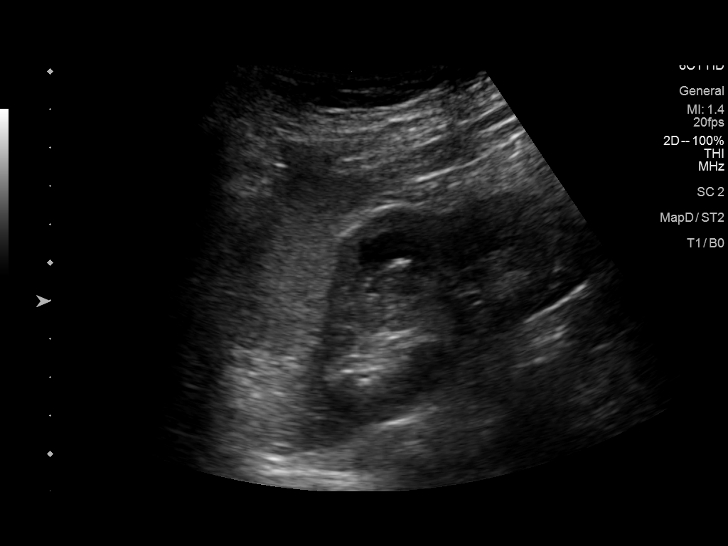
[im 32/48]
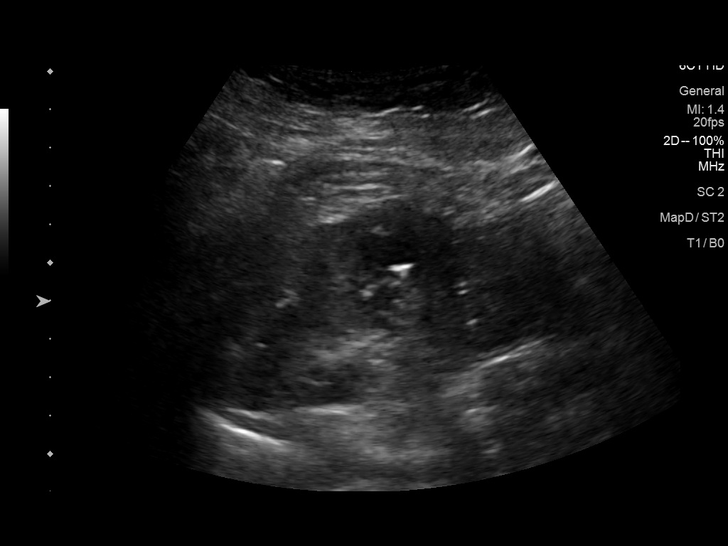
[im 36/48]
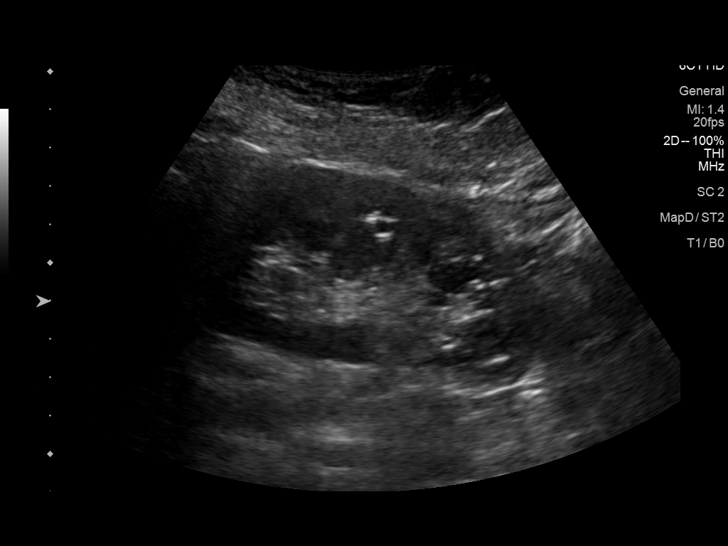
[im 40/48]
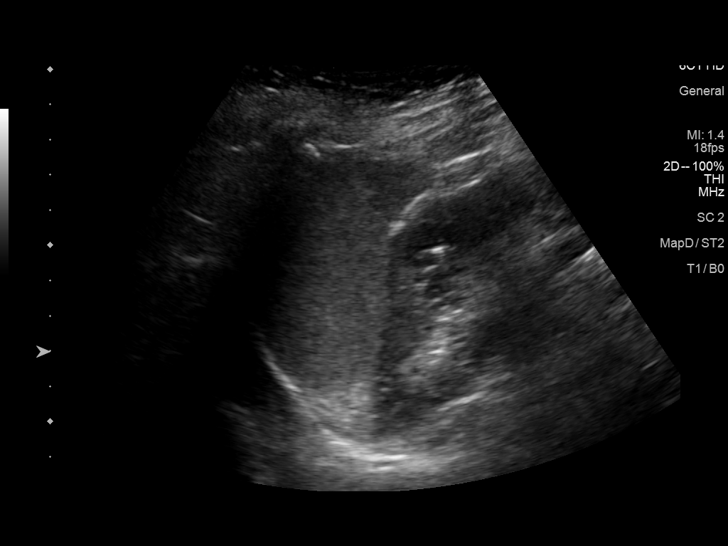
[im 44/48]
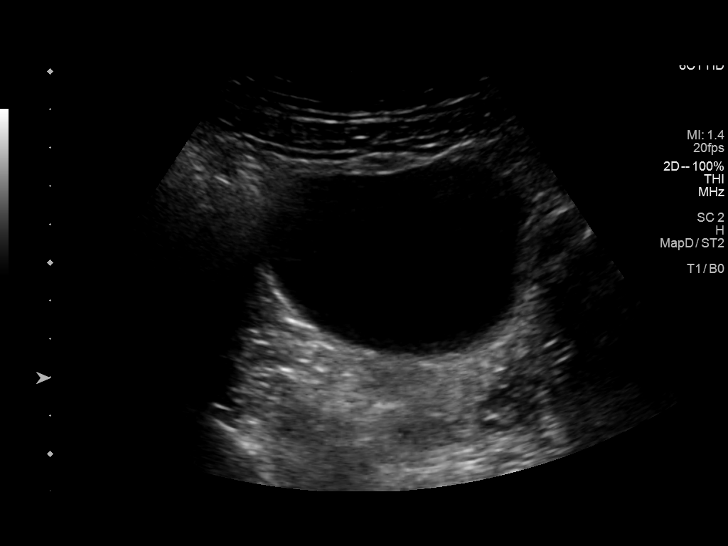
[im 48/48]
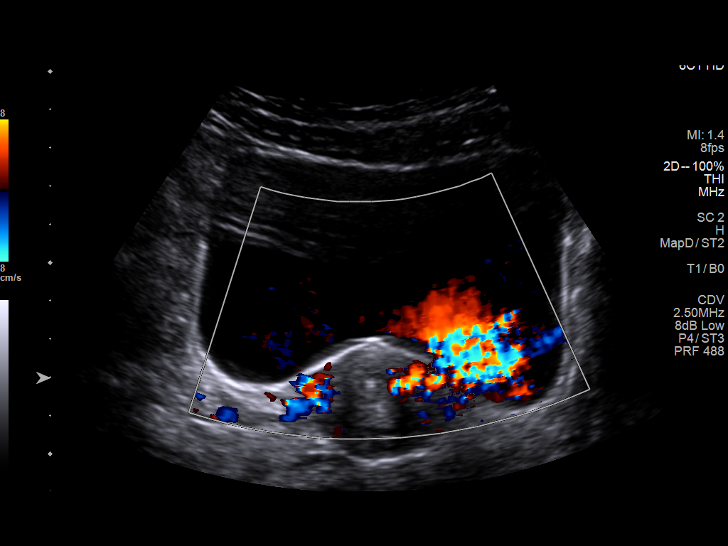

[13 of 25 positions shown; findings below may reference images not displayed]

FINDINGS: Right Kidney:

Renal measurements: 10.8 x 5.2 x 5.4 cm = volume: 159 mL. Normal
cortical thickness. Heterogeneous cortical echogenicity. Numerous
small cysts (fewer than 10) are identified measuring up to 2.0 cm in
greatest size at lower pole. Some of the cysts appear complicated by
internal echogenicity and septations. Multiple small echogenic foci
are seen which may represent complicating calcifications at some of
the cystic lesions. No hydronephrosis or definite solid mass.
Appearance is similar to that seen on the previous exam.

Left Kidney:

Renal measurements: 9.7 x 4.6 x 5.3 cm = volume: 125 mL. Normal
cortical thickness and echogenicity. Several small cysts are
identified (fewer than 10), largest 1.4 cm in greatest size at the
upper to mid kidney. No definite hydronephrosis. Few scattered
echogenic foci which may represent calcifications.

Bladder:

Appears normal for degree of bladder distention.

Other:

N/A
IMPRESSION: Multiple cysts throughout both kidneys RIGHT greater than LEFT, some
of which are complicated, consistent with history of polycystic
kidney disease.

Appearance of the kidneys is similar to that seen on the previous
exam.

No evidence of enlarging mass or hydronephrosis.

## 2021-03-05 ENCOUNTER — Other Ambulatory Visit: Payer: Self-pay | Admitting: Nephrology

## 2021-03-05 DIAGNOSIS — Q612 Polycystic kidney, adult type: Secondary | ICD-10-CM

## 2021-03-05 DIAGNOSIS — R109 Unspecified abdominal pain: Secondary | ICD-10-CM

## 2021-03-06 ENCOUNTER — Ambulatory Visit
Admission: RE | Admit: 2021-03-06 | Discharge: 2021-03-06 | Disposition: A | Discharge status: Home / Self Care | Payer: BC Managed Care – PPO | Source: Ambulatory Visit | Attending: Nephrology | Admitting: Nephrology

## 2021-03-06 DIAGNOSIS — Q612 Polycystic kidney, adult type: Secondary | ICD-10-CM

## 2021-03-06 DIAGNOSIS — R109 Unspecified abdominal pain: Secondary | ICD-10-CM

## 2021-07-16 NOTE — Progress Notes (Signed)
31 y.o. G0P0000 Married Other or two or more races Not Hispanic or Latino female here for annual exam.  She would like to talk about what to expect when she gets her IUD removed.  Mirena IUD inserted in 2/21. No dyspareunia.  Period Duration (Days): 7-8 Period Pattern: (!) Irregular Menstrual Flow: Light Menstrual Control: Panty liner, Tampon Menstrual Control Change Freq (Hours): 8 Dysmenorrhea: (!) Mild Dysmenorrhea Symptoms: Cramping, Nausea  H/O polycystic kidney disease, mild, normal kidney function. She hasn't seen a Dentist about this.  Considering pregnancy in the fall.   She has a h/o chicken pox.   She started on Paxil prior to her wedding for anxiety, plans to wean off now. Discussed weaning. Aware she shouldn't be on this and get pregnant.   FH of breast cancer in her sister at 41, negative genetic testing The patient was seen at the [facility], her risk of breast cancer was calculated at 22%, yearly breast MRI's were recommended at the time and yearly mammograms starting at age 35.   No LMP recorded. (Menstrual status: IUD).          Sexually active: Yes.    The current method of family planning is IUD.   Mirena inserted 2/21  Exercising: No.  The patient does not participate in regular exercise at present. Smoker:  no  Health Maintenance: Pap:  07/21/19 WNL 11/03/14 WNL Hr Hpv Neg  History of abnormal Pap:  no MMG:  none  BMD:   none  Colonoscopy: none  TDaP:  08/18/19 Gardasil: complete    reports that she has never smoked. She has never used smokeless tobacco. She reports that she does not drink alcohol and does not use drugs. She just got married on 06/08/21. 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  Past Medical History:  Diagnosis Date   Amenorrhea    Anxiety    Family history of breast cancer 07/26/2020   Migraine without aura    Polycystic kidney disease     Past Surgical History:  Procedure Laterality Date   WISDOM TOOTH EXTRACTION  2012/2013     Current Outpatient Medications  Medication Sig Dispense Refill   albuterol (VENTOLIN HFA) 108 (90 Base) MCG/ACT inhaler Inhale 1 puff into the lungs 4 (four) times daily as needed.     dicyclomine (BENTYL) 20 MG tablet Take 1 tablet (20 mg total) by mouth 3 (three) times daily before meals. As needed for abdominal cramping 20 tablet 0   levonorgestrel (MIRENA) 20 MCG/24HR IUD 1 each by Intrauterine route once.     lisinopril (ZESTRIL) 5 MG tablet Take 5 mg by mouth daily.     PARoxetine (PAXIL) 10 MG tablet Take 10 mg by mouth daily.     No current facility-administered medications for this visit.    Family History  Problem Relation Age of Onset   Brain cancer Paternal Grandmother 40   Diabetes Paternal Grandfather    Heart attack Paternal Grandfather    Stomach cancer Paternal Grandfather 58   Diabetes Paternal Uncle    Heart attack Maternal Grandfather    Hypertension Mother    Polycystic kidney disease Mother    Hypertension Father    Hypertension Sister    Breast cancer Sister 46       invasive ductal cancer, negative genetic testing   Breast cancer Other 47       MGM's sister   Prostate cancer Other        MGM's brother  Review of Systems  All other systems reviewed and are negative.  Exam:   BP 120/72    Pulse 74    Ht 5' (1.524 m)    Wt 164 lb (74.4 kg)    SpO2 100%    BMI 32.03 kg/m   Weight change: @WEIGHTCHANGE @ Height:   Height: 5' (152.4 cm)  Ht Readings from Last 3 Encounters:  07/24/21 5' (1.524 m)  07/19/20 5' (1.524 m)  03/29/20 5' (1.524 m)    General appearance: alert, cooperative and appears stated age Head: Normocephalic, without obvious abnormality, atraumatic Neck: no adenopathy, supple, symmetrical, trachea midline and thyroid normal to inspection and palpation Lungs: clear to auscultation bilaterally Cardiovascular: regular rate and rhythm Breasts: normal appearance, no masses or tenderness Abdomen: soft, non-tender; non distended,  no  masses,  no organomegaly Extremities: extremities normal, atraumatic, no cyanosis or edema Skin: Skin color, texture, turgor normal. No rashes or lesions Lymph nodes: Cervical, supraclavicular, and axillary nodes normal. No abnormal inguinal nodes palpated Neurologic: Grossly normal   Pelvic: External genitalia:  no lesions              Urethra:  normal appearing urethra with no masses, tenderness or lesions              Bartholins and Skenes: normal                 Vagina: normal appearing vagina with normal color and discharge, no lesions              Cervix: no lesions and IUD string 3 cm               Bimanual Exam:  Uterus:  normal size, contour, position, consistency, mobility, non-tender              Adnexa: no mass, fullness, tenderness               Rectovaginal: Confirms               Anus:  normal sphincter tone, no lesions  Gae Dry chaperoned for the exam.  1. Well woman exam Discussed breast self exam Discussed calcium and vit D intake No pap this year Labs with primary  2. IUD check up Doing well  3. Family history of breast cancer Her risk is 22%, she has seen genetics Recommendation for yearly MRI, too expensive She will start yearly mammograms when she turns 31 in the fall  4. Polycystic kidney disease Will refer to Genetics for evaluation and counseling about preimplantation testing. She will confirm with her Mother that she hasn't already had testing. -We also discussed sending her to the Fertility clinic to discuss IVF and preimplantation genetic testing -She has a f/u with her Nephrologist next month and will discuss it with them as well.   5. Encounter for preconception consultation See above She is considering pregnancy in the fall H/O chicken pox - Rubella screen -Discussed PNV.

## 2021-07-24 ENCOUNTER — Encounter: Payer: Self-pay | Admitting: Obstetrics and Gynecology

## 2021-07-24 ENCOUNTER — Other Ambulatory Visit: Payer: Self-pay

## 2021-07-24 ENCOUNTER — Telehealth: Payer: Self-pay

## 2021-07-24 ENCOUNTER — Ambulatory Visit (INDEPENDENT_AMBULATORY_CARE_PROVIDER_SITE_OTHER): Payer: BC Managed Care – PPO | Admitting: Obstetrics and Gynecology

## 2021-07-24 VITALS — BP 120/72 | HR 74 | Ht 60.0 in | Wt 164.0 lb

## 2021-07-24 DIAGNOSIS — Q613 Polycystic kidney, unspecified: Secondary | ICD-10-CM

## 2021-07-24 DIAGNOSIS — Z30431 Encounter for routine checking of intrauterine contraceptive device: Secondary | ICD-10-CM

## 2021-07-24 DIAGNOSIS — Z01419 Encounter for gynecological examination (general) (routine) without abnormal findings: Secondary | ICD-10-CM

## 2021-07-24 DIAGNOSIS — J45909 Unspecified asthma, uncomplicated: Secondary | ICD-10-CM | POA: Insufficient documentation

## 2021-07-24 DIAGNOSIS — Z803 Family history of malignant neoplasm of breast: Secondary | ICD-10-CM | POA: Diagnosis not present

## 2021-07-24 DIAGNOSIS — Z3169 Encounter for other general counseling and advice on procreation: Secondary | ICD-10-CM

## 2021-07-24 DIAGNOSIS — J4520 Mild intermittent asthma, uncomplicated: Secondary | ICD-10-CM

## 2021-07-24 DIAGNOSIS — K580 Irritable bowel syndrome with diarrhea: Secondary | ICD-10-CM | POA: Insufficient documentation

## 2021-07-24 DIAGNOSIS — L209 Atopic dermatitis, unspecified: Secondary | ICD-10-CM | POA: Insufficient documentation

## 2021-07-24 DIAGNOSIS — F411 Generalized anxiety disorder: Secondary | ICD-10-CM | POA: Insufficient documentation

## 2021-07-24 HISTORY — DX: Unspecified asthma, uncomplicated: J45.909

## 2021-07-24 HISTORY — DX: Generalized anxiety disorder: F41.1

## 2021-07-24 HISTORY — DX: Atopic dermatitis, unspecified: L20.9

## 2021-07-24 HISTORY — DX: Irritable bowel syndrome with diarrhea: K58.0

## 2021-07-24 HISTORY — DX: Mild intermittent asthma, uncomplicated: J45.20

## 2021-07-24 NOTE — Telephone Encounter (Addendum)
Message Received: Today Romualdo Bolk, MD  [facility] Triage   This patient has a genetic condition polycystic kidney disease. She needs referral to the Genetic counselor for prepregnancy counseling (not the [employer]). Can you please figure out who that is and set her up for an appointment

## 2021-07-24 NOTE — Telephone Encounter (Signed)
I spoke with Ebony Hail in [place].  Referral placed in Epic and she is going to call the patient and schedule the appointment.

## 2021-07-24 NOTE — Telephone Encounter (Signed)
I spoke with Sherrilyn Rist in [place] [facility]. She said sounds like patient needs to see a prenatal counselor. She is going to reach out to them and have them get in touch with [place] today. I provided my private line phone number.

## 2021-07-24 NOTE — Telephone Encounter (Signed)
Received referral from Dr. Salli Quarry office to get patient scheduled for pre-conception genetic counseling. Called patient and left a voicemail to call back to schedule.

## 2021-07-24 NOTE — Patient Instructions (Signed)
Preparing for Pregnancy °If you are planning to become pregnant, talk to your health care provider about preconception care. This type of care helps you prepare for a safe and healthy pregnancy. During this visit, your health care provider will: °Do a complete physical exam, including a Pap test. °Take your complete medical history. °Give you information, answer your questions, and help you resolve problems. °Preconception checklist °Medical history °Tell your health care provider about any medical conditions you have or have had. Your pregnancy or your ability to become pregnant may be affected by long-term (chronic) conditions, such as: °Diabetes. °High blood pressure (hypertension). °Thyroid problems. °Tell your health care provider about your family's medical history and your partner's medical history. °Tell your health care provider if you have or have had any sexually transmitted infections, orSTIs. These can affect your pregnancy. In some cases, they can be passed to your baby. °If needed, discuss the benefits of genetic testing. This test checks for conditions that may be passed from parent to child. °Tell your health care provider about: °Any problems you had getting pregnant or while pregnant. °Any medicines you take. These include vitamins, herbal supplements, and over-the-counter medicines. °Your history of getting vaccines. Discuss any vaccines that you may need. °Diet °Ask your health care provider about what foods to eat in order to get a balance of nutrients. This is especially important when you are pregnant or preparing to become pregnant. It is recommended that women of childbearing age take a folic acid supplement of 400 mcg daily and eat foods rich in folic acid to prevent certain birth defects. °Ask your health care provider to help you reach a healthy weight before pregnancy. °If you are overweight, you may have a higher risk for certain problems. These include hypertension, diabetes, and  early (preterm) birth. °If you are underweight, you are more likely to have a baby who has a low birth weight. °Lifestyle, work, and home °Let your health care provider know about: °Any lifestyle habits that you have, such as use of alcohol, drugs, or tobacco products. °Fun and leisure activities that may put you at risk during pregnancy, such as downhill skiing and certain exercise programs. °Any plans to travel out of the country, especially to places with an active Zika virus outbreak. °Harmful substances that you may be exposed to at work or at home. These include chemicals, pesticides, radiation, and substances from cat litter boxes. °Any concerns you have for your safety at home. °Mental health °Tell your health care provider about: °Any history of mental health conditions, including feelings of depression, sadness, or anxiety. °Any medicines that you take for a mental health condition. These include herbs and supplements. °How do I know that I am pregnant? °You may be pregnant if you have been sexually active and you miss your period. Other symptoms of early pregnancy include: °Mild cramping. °Very light vaginal bleeding (spotting). °Feeling more tired than usual. °Nausea and vomiting. These may be signs of morning sickness. °Take a home pregnancy test if you have any of these symptoms. This test checks for a hormone in your urine called human chorionic gonadotropin, or hCG. A woman's body begins to make this hormone during early pregnancy. These tests are very accurate. °Wait until at least the first day after you miss your period to take a home pregnancy test. If the test shows that you are pregnant, call your health care provider for a prenatal care visit. °What should I do if I become pregnant? °Schedule a   visit with your health care provider as soon as you suspect you are pregnant. Talk to your health care provider if you are taking prescription medicines to determine if they are safe to take during  pregnancy. You may continue to have sex if it does not cause pain or other problems, such as vaginal bleeding. Follow these instructions at home: Eating and drinking  Follow instructions from your health care provider about eating or drinking restrictions. Drink enough fluid to keep your urine pale yellow. Eat a balanced diet. This includes fresh fruits and vegetables, whole grains, lean meats, low-fat dairy products, healthy fats, and foods that are high in fiber. Ask to meet with a nutritionist or registered dietitian for help with meal planning and goals. Avoid eating raw or undercooked meat and seafood. Avoid eating or drinking unpasteurized dairy products. Lifestyle   Get regular exercise. Try to be active for at least 30 minutes a day on most days of the week. Ask your health care provider which activities are safe during pregnancy. Maintain a healthy weight. Avoid toxic fumes and chemicals. Avoid cleaning cat litter boxes. Cat feces may contain a harmful parasite called toxoplasma. Avoid travel to countries where [place] virus is common. Do not use any products that contain nicotine or tobacco, such as cigarettes, e-cigarettes, and chewing tobacco. If you need help quitting, ask your health care provider. Do not drink alcohol or use drugs. General instructions Keep an accurate record of your menstrual periods. This makes it easier for your health care provider to determine your baby's due date. Take over-the-counter and prescription medicines only as told by your health care provider. Begin taking prenatal vitamins and folic acid supplements daily as directed. Manage any chronic conditions, such as hypertension and diabetes, as told by your health care provider. This is important. Summary If you are planning to become pregnant, talk to your health care provider about preconception care. This is an important part of planning for a healthy pregnancy. Women of childbearing age should take  400 mcg of folic acid daily in addition to eating a diet rich in folic acid. This will prevent certain birth defects. Schedule a visit with your health care provider as soon as you suspect you are pregnant. Tell your health care provider about your medical history, lifestyle activities, home safety, and other things that may concern you. This information is not intended to replace advice given to you by your health care provider. Make sure you discuss any questions you have with your health care provider. Document Revised: 02/23/2019 Document Reviewed: 02/23/2019 Elsevier Patient Education  2022 Elsevier Inc.   EXERCISE   We recommended that you start or continue a regular exercise program for good health. Physical activity is anything that gets your body moving, some is better than none. The CDC recommends 150 minutes per week of Moderate-Intensity Aerobic Activity and 2 or more days of Muscle Strengthening Activity.  Benefits of exercise are limitless: helps weight loss/weight maintenance, improves mood and energy, helps with depression and anxiety, improves sleep, tones and strengthens muscles, improves balance, improves bone density, protects from chronic conditions such as heart disease, high blood pressure and diabetes and so much more. To learn more visit: http://kirby-bean.org/  DIET: Good nutrition starts with a healthy diet of fruits, vegetables, whole grains, and lean protein sources. Drink plenty of water for hydration. Minimize empty calories, sodium, sweets. For more information about dietary recommendations visit: CriticalGas.be and https://www.carpenter-henry.info/  ALCOHOL:  Women should limit their alcohol intake to  no more than 7 drinks/beers/glasses of wine (combined, not each!) per week. Moderation of alcohol intake to this level decreases your risk of breast cancer and liver damage.  If you are concerned  that you may have a problem, or your friends have told you they are concerned about your drinking, there are many resources to help. A well-known program that is free, effective, and available to all people all over the nation is Alcoholics Anonymous.  Check out this site to learn more: BeverageBargains.co.za   CALCIUM AND VITAMIN D:  Adequate intake of calcium and Vitamin D are recommended for bone health.  You should be getting between 1000-1200 mg of calcium and 800 units of Vitamin D daily between diet and supplements  PAP SMEARS:  Pap smears, to check for cervical cancer or precancers,  have traditionally been done yearly, scientific advances have shown that most women can have pap smears less often.  However, every woman still should have a physical exam from her gynecologist every year. It will include a breast check, inspection of the vulva and vagina to check for abnormal growths or skin changes, a visual exam of the cervix, and then an exam to evaluate the size and shape of the uterus and ovaries. We will also provide age appropriate advice regarding health maintenance, like when you should have certain vaccines, screening for sexually transmitted diseases, bone density testing, colonoscopy, mammograms, etc.   MAMMOGRAMS:  All women over 39 years old should have a routine mammogram.   COLON CANCER SCREENING: Now recommend starting at age 66. At this time colonoscopy is not covered for routine screening until 50. There are take home tests that can be done between 45-49.   COLONOSCOPY:  Colonoscopy to screen for colon cancer is recommended for all women at age 77.  We know, you hate the idea of the prep.  We agree, BUT, having colon cancer and not knowing it is worse!!  Colon cancer so often starts as a polyp that can be seen and removed at colonscopy, which can quite literally save your life!  And if your first colonoscopy is normal and you have no family history of colon cancer, most women don't  have to have it again for 10 years.  Once every ten years, you can do something that may end up saving your life, right?  We will be happy to help you get it scheduled when you are ready.  Be sure to check your insurance coverage so you understand how much it will cost.  It may be covered as a preventative service at no cost, but you should check your particular policy.      Breast Self-Awareness Breast self-awareness means being familiar with how your breasts look and feel. It involves checking your breasts regularly and reporting any changes to your health care provider. Practicing breast self-awareness is important. A change in your breasts can be a sign of a serious medical problem. Being familiar with how your breasts look and feel allows you to find any problems early, when treatment is more likely to be successful. All women should practice breast self-awareness, including women who have had breast implants. How to do a breast self-exam One way to learn what is normal for your breasts and whether your breasts are changing is to do a breast self-exam. To do a breast self-exam: Look for Changes  Remove all the clothing above your waist. Stand in front of a mirror in a room with good lighting. Put your  hands on your hips. Push your hands firmly downward. Compare your breasts in the mirror. Look for differences between them (asymmetry), such as: Differences in shape. Differences in size. Puckers, dips, and bumps in one breast and not the other. Look at each breast for changes in your skin, such as: Redness. Scaly areas. Look for changes in your nipples, such as: Discharge. Bleeding. Dimpling. Redness. A change in position. Feel for Changes Carefully feel your breasts for lumps and changes. It is best to do this while lying on your back on the floor and again while sitting or standing in the shower or tub with soapy water on your skin. Feel each breast in the following way: Place the  arm on the side of the breast you are examining above your head. Feel your breast with the other hand. Start in the nipple area and make  inch (2 cm) overlapping circles to feel your breast. Use the pads of your three middle fingers to do this. Apply light pressure, then medium pressure, then firm pressure. The light pressure will allow you to feel the tissue closest to the skin. The medium pressure will allow you to feel the tissue that is a little deeper. The firm pressure will allow you to feel the tissue close to the ribs. Continue the overlapping circles, moving downward over the breast until you feel your ribs below your breast. Move one finger-width toward the center of the body. Continue to use the  inch (2 cm) overlapping circles to feel your breast as you move slowly up toward your collarbone. Continue the up and down exam using all three pressures until you reach your armpit.  Write Down What You Find  Write down what is normal for each breast and any changes that you find. Keep a written record with breast changes or normal findings for each breast. By writing this information down, you do not need to depend only on memory for size, tenderness, or location. Write down where you are in your menstrual cycle, if you are still menstruating. If you are having trouble noticing differences in your breasts, do not get discouraged. With time you will become more familiar with the variations in your breasts and more comfortable with the exam. How often should I examine my breasts? Examine your breasts every month. If you are breastfeeding, the best time to examine your breasts is after a feeding or after using a breast pump. If you menstruate, the best time to examine your breasts is 5-7 days after your period is over. During your period, your breasts are lumpier, and it may be more difficult to notice changes. When should I see my health care provider? See your health care provider if you  notice: A change in shape or size of your breasts or nipples. A change in the skin of your breast or nipples, such as a reddened or scaly area. Unusual discharge from your nipples. A lump or thick area that was not there before. Pain in your breasts. Anything that concerns you.

## 2021-07-25 LAB — RUBELLA SCREEN: Rubella: 1 Index

## 2021-07-30 ENCOUNTER — Other Ambulatory Visit: Payer: Self-pay | Admitting: Internal Medicine

## 2021-07-30 DIAGNOSIS — Z1231 Encounter for screening mammogram for malignant neoplasm of breast: Secondary | ICD-10-CM

## 2021-08-06 NOTE — Telephone Encounter (Signed)
Appointment scheduled 09/02/21 at 2:30pm.

## 2021-08-20 ENCOUNTER — Ambulatory Visit
Admission: RE | Admit: 2021-08-20 | Discharge: 2021-08-20 | Disposition: A | Discharge status: Home / Self Care | Payer: BC Managed Care – PPO | Source: Ambulatory Visit | Attending: Internal Medicine | Admitting: Internal Medicine

## 2021-08-20 DIAGNOSIS — Z1231 Encounter for screening mammogram for malignant neoplasm of breast: Secondary | ICD-10-CM

## 2021-08-27 ENCOUNTER — Other Ambulatory Visit: Payer: Self-pay

## 2021-08-27 ENCOUNTER — Telehealth: Payer: Self-pay

## 2021-08-27 DIAGNOSIS — R102 Pelvic and perineal pain: Secondary | ICD-10-CM

## 2021-08-27 NOTE — Telephone Encounter (Signed)
Patient said for a couple of weeks she has been experiencing menstrual like cramping. She said several years ago (08/23/2019) after she had IUD inserted she experienced similar cramping and had u/s that showed a cyst on her ovary.  She said she did experience some left sided discomfort a few weeks ago but that has resolved and the crampiness persists. ? ?I offered her OV with Dr. Talbert [name] but she asked about having u/s at same time and I is going to need u/s would like to schedule one visit. ?I told her I will send her info to Dr. Talbert [name] and see what she recommends. ?

## 2021-08-27 NOTE — Telephone Encounter (Signed)
Message sent to appt desk to call patient and schedule u/s and visit with Dr. Lennon Alstrom. ?

## 2021-08-27 NOTE — Telephone Encounter (Signed)
That's fine, please set her up for an ultrasound and MD visit. I think there are openings on Thursday.  ?

## 2021-08-27 NOTE — Telephone Encounter (Signed)
U/s appt and visit scheduled 08/29/21. ?

## 2021-08-29 ENCOUNTER — Encounter: Payer: Self-pay | Admitting: Obstetrics and Gynecology

## 2021-08-29 ENCOUNTER — Other Ambulatory Visit: Payer: Self-pay

## 2021-08-29 ENCOUNTER — Ambulatory Visit: Payer: BC Managed Care – PPO

## 2021-08-29 ENCOUNTER — Ambulatory Visit (INDEPENDENT_AMBULATORY_CARE_PROVIDER_SITE_OTHER): Payer: BC Managed Care – PPO | Admitting: Obstetrics and Gynecology

## 2021-08-29 VITALS — BP 124/80 | Ht 59.0 in | Wt 164.0 lb

## 2021-08-29 DIAGNOSIS — R102 Pelvic and perineal pain unspecified side: Secondary | ICD-10-CM

## 2021-08-29 DIAGNOSIS — Z3009 Encounter for other general counseling and advice on contraception: Secondary | ICD-10-CM | POA: Diagnosis not present

## 2021-08-29 DIAGNOSIS — N83201 Unspecified ovarian cyst, right side: Secondary | ICD-10-CM | POA: Diagnosis not present

## 2021-08-29 DIAGNOSIS — N83202 Unspecified ovarian cyst, left side: Secondary | ICD-10-CM | POA: Diagnosis not present

## 2021-08-29 NOTE — Patient Instructions (Signed)

## 2021-08-29 NOTE — Progress Notes (Signed)
GYNECOLOGY  VISIT ?  ?HPI: ?31 y.o.   Married Other or two or more races Not Hispanic or Latino  female   ?G0P0000 with Patient's last menstrual period was 08/06/2021.   ?here c/o pelvic cramping. She c/o a several week h/o pelvic cramping. ?The pain is similar to when she had an ovarian cyst in the past. ?She has a Mirena IUD, inserted in 2/21.  ? ?She gets monthly spotting off and on x 6 days (in a row). Prior to the IUD she had heavy bleeding and cramping. Her cramping with the IUD is less severe but lasts longer.  ? ?This episode of pain started 3 weeks ago in the LLQ/pelvis. Felt sharp, up to a 6/10 in severity. That pain occurred off and on x 3 days. Then she developed generalized menstrual like cramps over her whole lower abdomen. The cramps are intermittent, worse at the end of the day, up to a 3/10 in severity. Has some pelvic discomfort when emptying her bladder. No urinary frequency, urgency or burning with urination. ? ?Sexually active, no dyspareunia.  ? ? ?GYNECOLOGIC HISTORY: ?Patient's last menstrual period was 08/06/2021. ?Contraception:Mirena IUD ?Menopausal hormone therapy: NA ?       ?OB History   ? ? Gravida  ?0  ? Para  ?0  ? Term  ?0  ? Preterm  ?0  ? AB  ?0  ? Living  ?0  ?  ? ? SAB  ?0  ? IAB  ?0  ? Ectopic  ?0  ? Multiple  ?0  ? Live Births  ?   ?   ?  ?  ?    ? ?Patient Active Problem List  ? Diagnosis Date Noted  ? Asthma 07/24/2021  ? Atopic dermatitis 07/24/2021  ? Generalized anxiety disorder 07/24/2021  ? Irritable bowel syndrome with diarrhea 07/24/2021  ? Mild intermittent asthma 07/24/2021  ? Family history of breast cancer 07/26/2020  ? Migraine without aura   ? Migraine headache without aura 11/03/2014  ? ? ?Past Medical History:  ?Diagnosis Date  ? Amenorrhea   ? Anxiety   ? Family history of breast cancer 07/26/2020  ? Migraine without aura   ? Polycystic kidney disease   ? ? ?Past Surgical History:  ?Procedure Laterality Date  ? [place] EXTRACTION  2012/2013  ? ? ?Current  Outpatient Medications  ?Medication Sig Dispense Refill  ? albuterol (VENTOLIN HFA) 108 (90 Base) MCG/ACT inhaler Inhale 1 puff into the lungs 4 (four) times daily as needed.    ? dicyclomine (BENTYL) 20 MG tablet Take 1 tablet (20 mg total) by mouth 3 (three) times daily before meals. As needed for abdominal cramping 20 tablet 0  ? levonorgestrel (MIRENA) 20 MCG/24HR IUD 1 each by Intrauterine route once.    ? lisinopril (ZESTRIL) 5 MG tablet Take 5 mg by mouth daily.    ? PARoxetine (PAXIL) 10 MG tablet Take 10 mg by mouth daily.    ? ?No current facility-administered medications for this visit.  ?  ? ?ALLERGIES: Ibuprofen ? ?Family History  ?Problem Relation Age of Onset  ? Brain cancer Paternal Grandmother 76  ? Diabetes Paternal Grandfather   ? Heart attack Paternal Grandfather   ? Stomach cancer Paternal Grandfather 49  ? Diabetes Paternal Uncle   ? Heart attack Maternal Grandfather   ? Hypertension Mother   ? Polycystic kidney disease Mother   ? Hypertension Father   ? Hypertension Sister   ? Breast cancer Sister  83  ?     invasive ductal cancer, negative genetic testing  ? Breast cancer Other 13  ?     MGM's sister  ? Prostate cancer Other   ?     MGM's brother  ? ? ?Social History  ? ?Socioeconomic History  ? Marital status: Married  ?  Spouse name: Not on file  ? Number of children: Not on file  ? Years of education: Not on file  ? Highest education level: Not on file  ?Occupational History  ? Not on file  ?Tobacco Use  ? Smoking status: Never  ? Smokeless tobacco: Never  ?Vaping Use  ? Vaping Use: Never used  ?Substance and Sexual Activity  ? Alcohol use: No  ?  Alcohol/week: 0.0 standard drinks  ? Drug use: No  ? Sexual activity: Yes  ?  Partners: Male  ?Other Topics Concern  ? Not on file  ?Social History Narrative  ? Not on file  ? ?Social Determinants of Health  ? ?Financial Resource Strain: Not on file  ?Food Insecurity: Not on file  ?Transportation Needs: Not on file  ?Physical Activity: Not on file   ?Stress: Not on file  ?Social Connections: Not on file  ?Intimate Partner Violence: Not on file  ? ? ?ROS ? ?PHYSICAL EXAMINATION:   ? ?LMP 08/06/2021     ?General appearance: alert, cooperative and appears stated age ?Abdomen: soft, non-tender; non distended, no masses,  no organomegaly ? ?Pelvic ultrasound ? ?Indications: Pelvic pain ? ?Findings: ? ?Uterus 6.62 x 4.36 x 3.62 cm, retroverted ? ?Endometrium IUD is in place, no masses or thickening ? ?Left ovary 4.33 x 3.22 x 3.62 cm, + perfusion ?4.02 x 3.21 cm simple cyst ? ?Right ovary 4.03 x 3.50 x 4.35 cm, + perfusion ?3.48 x 3.48 cm complex cyst with internal echoes, avascular ? ?Trace free fluid ? ? ?Impression:  ?Normal sized retroverted uterus ?IUD in place ?Bilateral avascular, ovarian cysts, simple on the left, complex on the right.  ? ?1. Bilateral ovarian cysts ?Discussed ovarian cysts, including risk of rupture and torsion ?- US PELVIS TRANSVAGINAL NON-OB (TV ONLY); Future (3 months) ? ?2. Pelvic pain ?Bilateral ovarian cysts. We discussed that the mirena IUD increases the risk of forming ovarian cysts. ?-Tylenol for pain (can't take ibuprofen) ?-Call with worsening pain ? ?3. General counseling and advice on female contraception ?She is considering pregnancy in the fall.  ?We discussed pulling the IUD and using condoms. She may do this in a month or two. ?Start PNV prior to pulling the IUD.  ? ?

## 2021-08-30 ENCOUNTER — Encounter: Payer: Self-pay | Admitting: Genetics

## 2021-09-02 ENCOUNTER — Ambulatory Visit: Payer: BC Managed Care – PPO | Attending: Obstetrics and Gynecology | Admitting: Genetics

## 2021-09-02 ENCOUNTER — Ambulatory Visit: Payer: Self-pay | Admitting: Genetics

## 2021-09-02 ENCOUNTER — Other Ambulatory Visit: Payer: Self-pay

## 2021-09-02 DIAGNOSIS — Z315 Encounter for genetic counseling: Secondary | ICD-10-CM

## 2021-09-03 ENCOUNTER — Encounter: Payer: Self-pay | Admitting: Obstetrics and Gynecology

## 2021-09-03 NOTE — Progress Notes (Addendum)
?Name: Rebecca [name] Indication: Preconception counseling  ?DOB: 16-Oct-1990 Age: 31 y.o.   ?EDC: Not currently pregnant LMP: Did not ask Referring Provider:  ?Romualdo Bolk, MD  ?EGA: Not currently pregnant Genetic Counselor: ?Teena Dunk, MS, CGC  ?OB Hx: G0P0 Date of Appointment: 09/02/2021  ?Accompanied by: ?A genetic counseling graduate student observed ?Rebecca [name]'s reproductive partner Rebecca [name]) listened in on some of the session Tele-health Time: 30 Minutes. Genetic counseling connected with Arine via Webex.  ? ?Family History: A pedigree was created and scanned into Epic under the Media tab. 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 She reports a family history of Autosomal Dominant Polycystic Kidney Disease in her full sister, mother, maternal aunt, maternal cousin, maternal grandmother, and maternal great aunt. ?Rebecca [name] has a significant family history of cancer and is followed by cancer genetic counselors through [facility] (please see separate consultation report for details). ?Rebecca [name]'s ethnicity reported as English/Irish/Italian and her reproductive partner's ethnicity reported as English/Irish. Denies Ashkenazi Jewish ancestry.  ?   ?Genetic Counseling:  ? ?Polycystic Kidney Disease. Rebecca [name] was referred to genetic counseling to discuss the risk for her future biological children to be affected with Polycystic Kidney Disease (PKD). 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 She reports she has not had any confirmatory genetic testing for PKD. She reports a family history of PKD in her full sister, mother, maternal aunt, maternal cousin, maternal grandmother, and maternal great aunt. Autosomal dominant PKD is generally a later-onset multisystem disorder characterized by bilateral renal cysts, liver cysts, and an increased risk of intracranial aneurysms. Other clinical features of the condition include cysts in the pancreas, seminal vesicles, and  arachnoid membrane; dilatation of the aortic root and dissection of the thoracic aorta; mitral valve prolapse; and abdominal wall hernias. Renal manifestations include hypertension, renal pain, and renal insufficiency. Approximately 50% of individuals with autosomal dominant PKD have end-stage renal disease by age 8 years. Genetic counseling offered Rebecca [name] confirmatory genetic testing for autosomal dominant PKD given that it has not been completed for her previously. If her genetic testing confirms a diagnosis of autosomal dominant PKD, the risk to all of her future biological children to also be affected would be 50%. If her genetic testing did not confirm a diagnosis of autosomal dominant PKD, we would recommend she be seen by an adult genetics clinic for further evaluation and possibly testing for other genetic conditions in which cystic kidneys are a clinical feature. Genetic counseling reviewed that if Rebecca [name] completed genetic testing for herself and her results confirmed a genetic diagnosis, her results could be used for genetic testing in a future pregnancy. Genetic counseling is happy to discuss this further at that time. Rebecca [name] declined genetic testing for herself for PKD during today's appointment.  ? ?Carrier screening. Per the ACOG Committee Opinion 691, all women who are considering a pregnancy or are currently pregnant should be offered carrier screening for, at minimum, Cystic Fibrosis (CF), Spinal Muscular Atrophy (SMA), and Hemoglobinopathies. Genetic counseling also offered Rebecca [name] the option of Expanded Carrier Screening for a larger panel of X-Linked and autosomal recessive conditions. The mode of inheritance, clinical manifestations of these conditions, as well as details about testing were reviewed. A negative result on carrier screening reduces the likelihood of being a carrier, however, does not entirely rule out the possibility. If Rebecca [name] was found to be a carrier for a specific condition, carrier  screening for their reproductive partner would be recommended. Rebecca [name] stated  she is going to speak with her reproductive partner about carrier screening. Genetic counseling encouraged her to contact our office if she would like to proceed with this screening in the future. ?  ? ?Thank you for sharing in the care of [name] with [place].  ?Please do not hesitate to contact us if you have any questions. ? ?Rebecca Righter, MS, CGC ?Certified Genetic Counselor ?

## 2021-12-17 ENCOUNTER — Ambulatory Visit: Payer: BC Managed Care – PPO

## 2021-12-17 ENCOUNTER — Encounter: Payer: Self-pay | Admitting: Obstetrics and Gynecology

## 2021-12-17 ENCOUNTER — Ambulatory Visit (INDEPENDENT_AMBULATORY_CARE_PROVIDER_SITE_OTHER): Payer: BC Managed Care – PPO | Admitting: Obstetrics and Gynecology

## 2021-12-17 VITALS — BP 122/74 | Wt 160.0 lb

## 2021-12-17 DIAGNOSIS — N83202 Unspecified ovarian cyst, left side: Secondary | ICD-10-CM

## 2021-12-17 DIAGNOSIS — N83201 Unspecified ovarian cyst, right side: Secondary | ICD-10-CM

## 2021-12-17 DIAGNOSIS — Z30432 Encounter for removal of intrauterine contraceptive device: Secondary | ICD-10-CM

## 2021-12-17 NOTE — Progress Notes (Signed)
GYNECOLOGY  VISIT   HPI: 31 y.o.   Married Other or two or more races Not Hispanic or Latino  female   G0P0000 with No LMP recorded. (Menstrual status: IUD).   here for ultrasound follow up for ovarian cysts. She would also like her IUD removed today, but if not she understands.  She is on PNV, has immunity to [place]. She has been seen for genetic counseling for polycystic kidney disease.   GYNECOLOGIC HISTORY: No LMP recorded. (Menstrual status: IUD). Contraception:IUD Menopausal hormone therapy: none         OB History     Gravida  0   Para  0   Term  0   Preterm  0   AB  0   Living  0      SAB  0   IAB  0   Ectopic  0   Multiple  0   Live Births                 Patient Active Problem List   Diagnosis Date Noted   Asthma 07/24/2021   Atopic dermatitis 07/24/2021   Generalized anxiety disorder 07/24/2021   Irritable bowel syndrome with diarrhea 07/24/2021   Mild intermittent asthma 07/24/2021   Family history of breast cancer 07/26/2020   Migraine without aura    Migraine headache without aura 11/03/2014    Past Medical History:  Diagnosis Date   Amenorrhea    Anxiety    Family history of breast cancer 07/26/2020   Migraine without aura    Polycystic kidney disease     Past Surgical History:  Procedure Laterality Date   WISDOM TOOTH EXTRACTION  2012/2013    Current Outpatient Medications  Medication Sig Dispense Refill   albuterol (VENTOLIN HFA) 108 (90 Base) MCG/ACT inhaler Inhale 1 puff into the lungs 4 (four) times daily as needed.     dicyclomine (BENTYL) 20 MG tablet Take 1 tablet (20 mg total) by mouth 3 (three) times daily before meals. As needed for abdominal cramping 20 tablet 0   levonorgestrel (MIRENA) 20 MCG/24HR IUD 1 each by Intrauterine route once.     tiZANidine (ZANAFLEX) 4 MG tablet Take 4 mg by mouth 2 (two) times daily.     No current facility-administered medications for this visit.     ALLERGIES: Ibuprofen  Family  History  Problem Relation Age of Onset   Brain cancer Paternal Grandmother 51   Diabetes Paternal Grandfather    Heart attack Paternal Grandfather    Stomach cancer Paternal Grandfather 11   Diabetes Paternal Uncle    Heart attack Maternal Grandfather    Hypertension Mother    Polycystic kidney disease Mother    Hypertension Father    Hypertension Sister    Breast cancer Sister 75       invasive ductal cancer, negative genetic testing   Breast cancer Other 71       MGM's sister   Prostate cancer Other        MGM's brother    Social History   Socioeconomic History   Marital status: Married    Spouse name: Not on file   Number of children: Not on file   Years of education: Not on file   Highest education level: Not on file  Occupational History   Not on file  Tobacco Use   Smoking status: Never   Smokeless tobacco: Never  Vaping Use   Vaping Use: Never used  Substance and Sexual Activity  Alcohol use: No    Alcohol/week: 0.0 standard drinks of alcohol   Drug use: No   Sexual activity: Yes    Partners: Male  Other Topics Concern   Not on file  Social History Narrative   Not on file   Social Determinants of Health   Financial Resource Strain: Not on file  Food Insecurity: Not on file  Transportation Needs: Not on file  Physical Activity: Not on file  Stress: Not on file  Social Connections: Not on file  Intimate Partner Violence: Not on file    Review of Systems  All other systems reviewed and are negative.  Pelvic ultrasound  Indications: f/u ovarian cysts  Findings:  Uterus 7.51 x 4.40 x 3.51 cm, retroverted  Endometrium 3.48 cm IUD within proper location of the endometrium  Left ovary 2.67 x 2.11 x 2.09 cm  Right ovary 2.67 x 1.75 x 1.97 cm 1.4 x 1.2 cm resolving corpus luteum  Previously seen cysts have resolved bilaterally  No free fluid  Impression:  Normal sized, retroverted uterus Thin, symmetrical endometrium IUD in place Both  ovaries are mobile, normal sized, with normal follicular pattern and normal perfusion 1.4 cm resolving right corpus luteum Previously seen cysts have resolved   PHYSICAL EXAMINATION:    BP 122/74   Wt 160 lb (72.6 kg)   BMI 32.32 kg/m     General appearance: alert, cooperative and appears stated age   Pelvic: External genitalia:  no lesions              Urethra:  normal appearing urethra with no masses, tenderness or lesions              Bartholins and Skenes: normal                 Vagina: normal appearing vagina with normal color and discharge, no lesions              Cervix: no lesions and IUD string 2 cm. IUD removed with ringed forceps.               1. Bilateral ovarian cysts Resolved  2. Encounter for IUD removal IUD removed She is on PNV She has had genetic counseling

## 2021-12-19 ENCOUNTER — Telehealth: Payer: Self-pay | Admitting: Obstetrics and Gynecology

## 2021-12-19 NOTE — Telephone Encounter (Signed)
31 yo Go reporting bleeding after IUD removal 12/17/21.has had IUD for 2.5 years with only spotting. Bleeding was light 12/18/21 and picked up today with 1 episode from 3:00-7:00 pm requiring 1 tampon with some clots. Bleeding is slowing down. Cramping is minimal. Recommend Tylenol for cramping and to monitor bleeding. Should report if bleeding is excessive or persistent. Questions answered. Patient agreeable.

## 2022-01-30 ENCOUNTER — Ambulatory Visit: Payer: BC Managed Care – PPO | Admitting: Obstetrics and Gynecology

## 2022-03-28 ENCOUNTER — Telehealth: Payer: Self-pay

## 2022-03-28 ENCOUNTER — Other Ambulatory Visit: Payer: BC Managed Care – PPO

## 2022-03-28 ENCOUNTER — Telehealth (HOSPITAL_BASED_OUTPATIENT_CLINIC_OR_DEPARTMENT_OTHER): Payer: Self-pay | Admitting: Obstetrics & Gynecology

## 2022-03-28 DIAGNOSIS — N926 Irregular menstruation, unspecified: Secondary | ICD-10-CM

## 2022-03-28 NOTE — Telephone Encounter (Signed)
31 yo G1 with recent positive UPT now having a little spotting and mild cramping.  She did have an HCG today at [facility].  Pt not sure of plan after lab result is back.  Spotting is pink only and actually seems to have stopped.  Was concerned and just wanted to reassurance and plan.    Causes for first trimester bleeding discussed.  With pink spotting only, implantation bleeding most likely but other causes discussed as well.  HCG levels and appropriate increases discussed.  Would suggest repeating this on Monday with blood type as pt is unsure of her blood type.    Reasons for being seen in MAU given.  Directions for location discussed as well.    Will route to Glorianne Manchester, RN, and Sumner Boast, MD, for specific recommendations for pt.

## 2022-03-28 NOTE — Telephone Encounter (Signed)
Pt notified and voiced understanding. Will be here at 130 for lab.

## 2022-03-28 NOTE — Telephone Encounter (Signed)
JJ pt calling to report missed period and faint line on UPT strip test and +pregnancy on a digital test. Reports been trying for conception. LMP: 02/24/2022. Denies any pain/bleeding or any other signs/sxs of pregnancy. Pt desires to know if she should be seen or if she can have blood test done to confirm pregnancy. Please advise.

## 2022-03-28 NOTE — Telephone Encounter (Signed)
Ok to [employer], does not need to be seen.

## 2022-03-29 ENCOUNTER — Encounter (HOSPITAL_COMMUNITY): Payer: Self-pay | Admitting: Family Medicine

## 2022-03-29 ENCOUNTER — Inpatient Hospital Stay (HOSPITAL_COMMUNITY)
Admission: AD | Admit: 2022-03-29 | Discharge: 2022-03-29 | Disposition: A | Discharge status: Home / Self Care | Payer: BC Managed Care – PPO | Attending: Family Medicine | Admitting: Family Medicine

## 2022-03-29 ENCOUNTER — Telehealth (HOSPITAL_BASED_OUTPATIENT_CLINIC_OR_DEPARTMENT_OTHER): Payer: Self-pay | Admitting: Obstetrics & Gynecology

## 2022-03-29 DIAGNOSIS — O26891 Other specified pregnancy related conditions, first trimester: Secondary | ICD-10-CM

## 2022-03-29 DIAGNOSIS — O3680X0 Pregnancy with inconclusive fetal viability, not applicable or unspecified: Secondary | ICD-10-CM | POA: Diagnosis not present

## 2022-03-29 DIAGNOSIS — Z3A01 Less than 8 weeks gestation of pregnancy: Secondary | ICD-10-CM | POA: Diagnosis not present

## 2022-03-29 DIAGNOSIS — O209 Hemorrhage in early pregnancy, unspecified: Secondary | ICD-10-CM | POA: Insufficient documentation

## 2022-03-29 DIAGNOSIS — Z6791 Unspecified blood type, Rh negative: Secondary | ICD-10-CM | POA: Insufficient documentation

## 2022-03-29 DIAGNOSIS — O99511 Diseases of the respiratory system complicating pregnancy, first trimester: Secondary | ICD-10-CM | POA: Diagnosis not present

## 2022-03-29 HISTORY — DX: Unspecified ovarian cyst, unspecified side: N83.209

## 2022-03-29 HISTORY — DX: Urinary tract infection, site not specified: N39.0

## 2022-03-29 LAB — HCG, QUANTITATIVE, PREGNANCY
HCG, Total, QN: 22 m[IU]/mL
hCG, Beta Chain, Quant, S: 30 m[IU]/mL — ABNORMAL HIGH (ref <5)

## 2022-03-29 MED ORDER — RHO D IMMUNE GLOBULIN 1500 UNIT/2ML IJ SOSY
300.0000 ug | PREFILLED_SYRINGE | Freq: Once | INTRAMUSCULAR | Status: AC
  Administered 2022-03-29: 300 ug via INTRAMUSCULAR
  Filled 2022-03-29: qty 2

## 2022-03-29 NOTE — MAU Provider Note (Signed)
History     [id number]  Arrival date and time: 03/29/22 1120    Chief Complaint  Patient presents with   Vaginal Bleeding     HPI Rebecca [name] is a 31 y.o. at [redacted]w[redacted]d who presents for vaginal bleeding. Had positive pregnancy test last week & HCG in the office yesterday of 22. Reports vaginal bleeding since last night. Was pink spotting but now darker red. No bleeding into pads or passing clots. Denies abdominal pain.   OB History     Gravida  1   Para  0   Term  0   Preterm  0   AB  0   Living  0      SAB  0   IAB  0   Ectopic  0   Multiple  0   Live Births              Past Medical History:  Diagnosis Date   Amenorrhea    Anxiety    Asthma    Family history of breast cancer 07/26/2020   Migraine without aura    Ovarian cyst    Polycystic kidney disease    UTI (urinary tract infection)     Past Surgical History:  Procedure Laterality Date   NO PAST SURGERIES     WISDOM TOOTH EXTRACTION  2012/2013    Family History  Problem Relation Age of Onset   Kidney disease Mother    Hypertension Mother    Polycystic kidney disease Mother    Hypertension Father    Hypertension Sister    Breast cancer Sister 7       invasive ductal cancer, negative genetic testing   Diabetes Paternal Uncle    Heart attack Maternal Grandfather    Brain cancer Paternal Grandmother 71   Diabetes Paternal Grandfather    Heart attack Paternal Grandfather    Stomach cancer Paternal Grandfather 51   Breast cancer Other 93       MGM's sister   Prostate cancer Other        MGM's brother    Allergies  Allergen Reactions   Ibuprofen Anaphylaxis    Angioedema    No current facility-administered medications on [name] prior to encounter.   Current Outpatient Medications on [name] Prior to Encounter  Medication Sig Dispense Refill   Prenatal Vit-Fe Fumarate-FA (MULTIVITAMIN-PRENATAL) 27-0.8 MG TABS tablet Take 1 tablet by mouth daily at 12 noon.     albuterol (VENTOLIN  HFA) 108 (90 Base) MCG/ACT inhaler Inhale 1 puff into the lungs 4 (four) times daily as needed.     dicyclomine (BENTYL) 20 MG tablet Take 1 tablet (20 mg total) by mouth 3 (three) times daily before meals. As needed for abdominal cramping 20 tablet 0   levonorgestrel (MIRENA) 20 MCG/24HR IUD 1 each by Intrauterine route once.     tiZANidine (ZANAFLEX) 4 MG tablet Take 4 mg by mouth 2 (two) times daily.       ROS Pertinent positives and negative per HPI, all others reviewed and negative  Physical Exam   BP (!) 141/90 (BP Location: Right Arm)   Pulse 85   Temp 98.9 F (37.2 C) (Oral)   Resp 17   Ht 5' (1.524 m)   LMP 02/24/2022   SpO2 99%   BMI 31.25 kg/m   Patient Vitals for the past 24 hrs:  BP Temp Temp src Pulse Resp SpO2 Height  03/29/22 1209 (!) 141/90 98.9 F (37.2 C) Oral 85 17 99 %  5' (1.524 m)    Physical Exam Vitals and nursing note reviewed.  Constitutional:      General: She is not in acute distress.    Appearance: Normal appearance.  HENT:     Head: Normocephalic and atraumatic.  Eyes:     General: No scleral icterus.    Conjunctiva/sclera: Conjunctivae normal.     Pupils: Pupils are equal, round, and reactive to light.  Pulmonary:     Effort: Pulmonary effort is normal. No respiratory distress.  Neurological:     Mental Status: She is alert.  Psychiatric:        Mood and Affect: Mood normal.        Behavior: Behavior normal.      Labs Results for orders placed or performed during the hospital encounter of 03/29/22 (from the past 24 hour(s))  hCG, quantitative, pregnancy     Status: Abnormal   Collection Time: 03/29/22 12:07 PM  Result Value Ref Range   hCG, Beta Chain, Quant, S 30 (H) <5 mIU/mL  Rh IG workup (includes ABO/Rh)     Status: None (Preliminary result)   Collection Time: 03/29/22 12:07 PM  Result Value Ref Range   Gestational Age(Wks) 4    ABO/RH(D) O NEG    Antibody Screen NEG    Unit Number [id number]    Blood Component Type  RHIG    Unit division 00    Status of Unit ISSUED    Transfusion Status      OK TO TRANSFUSE Performed at [facility], [address] [place], [place] [address]     Imaging No results found.  MAU Course  Procedures Lab Orders         hCG, quantitative, pregnancy    Meds ordered this encounter  Medications   rho (d) immune globulin (RHIG/RHOPHYLAC) injection 300 mcg   Imaging Orders  No imaging studies ordered today    MDM Received call from Dr. Hyacinth Meeker who is taking call for Dr. Kateri Plummer office this weekend. Patient sent to MAU for rhogam since the patient believes she is rh negative.   Labs ordered in MAU. Confirms patient is RH negative. Rhogam given.  HCG today is 30. Last HCG was yesterday & was 22. Informed patient that diagnosis cannot be made at this time.   Assessment and Plan   1. Vaginal bleeding in pregnancy, first trimester   2. Pregnancy of unknown anatomic location   3. Rh negative status during pregnancy in first trimester   4. [redacted] weeks gestation of pregnancy    -Reviewed bleeding & ectopic precautions -Message sent to Dr. Oscar La & Carmelina Dane (per Dr. Hyacinth Meeker) for follow up appointment next week  Judeth Horn, NP 03/29/22 1:47 PM

## 2022-03-29 NOTE — Telephone Encounter (Signed)
Pt called back this morning reporting increased bleeding like a period.  HCG is low.  She thinks her blood type is O neg.  Recommended she be seen in the MAU to receive Rhogam and that HCG levels will need to be followed down to normal.  This can be done in the office as well as MAU.  Advised pt I would like Dr. Talbert [name] and Glorianne Manchester, RN, know for planning follow up.

## 2022-03-29 NOTE — MAU Note (Signed)
Rebecca [name] is a 31 y.o. at [redacted]w[redacted]d here in MAU reporting: was seen yesterday for HCG level. Started bleeding last night around 7, was very light and pink.  More so when she wiped, darker now. No clots. No pain., occ mild cramps LMP: 9/18 Onset of complaint: 1900 Pain score: 0 Vitals:   03/29/22 1209  BP: (!) 141/90  Pulse: 85  Resp: 17  Temp: 98.9 F (37.2 C)  SpO2: 99%     Lab orders placed from triage:  lab work drawn in triage

## 2022-03-30 LAB — RH IG WORKUP (INCLUDES ABO/RH)
ABO/RH(D): O NEG
Antibody Screen: NEGATIVE
Gestational Age(Wks): 4
Unit division: 0

## 2022-03-31 ENCOUNTER — Other Ambulatory Visit: Payer: Self-pay

## 2022-03-31 ENCOUNTER — Other Ambulatory Visit: Payer: BC Managed Care – PPO

## 2022-03-31 ENCOUNTER — Telehealth: Payer: Self-pay

## 2022-03-31 DIAGNOSIS — O209 Hemorrhage in early pregnancy, unspecified: Secondary | ICD-10-CM

## 2022-03-31 LAB — HCG, QUANTITATIVE, PREGNANCY: HCG, Total, QN: 12 m[IU]/mL

## 2022-03-31 NOTE — Telephone Encounter (Signed)
I called and spoke with patient and advised Dr. Talbert [name] recommended Quant HCG on Weds.  Patient is tearful and states her anxiety is high.  She would love to come today and have another quant.  She said the Quant actually was up (from 56 in office to 39 in MAU) and she said her anxiety would be better if she could just check it today and see what is happening with it.  She has bled like a period with clots since Saturday but no cramping.  Grady for [employer] today?

## 2022-03-31 NOTE — Telephone Encounter (Signed)
Patient seen in ER 03/29/22. She called to schedule follow up. No appt openings today. Please advise what she needs scheduled and when is acceptable.  Megan Salon, MD    03/29/22 10:17 AM Note Pt called back this morning reporting increased bleeding like a period.  HCG is low.  She thinks her blood type is O neg.  Recommended she be seen in the MAU to receive Rhogam and that HCG levels will need to be followed down to normal.  This can be done in the office as well as MAU.  Advised pt I would like Dr. Talbert [name] and Glorianne Manchester, RN, know for planning follow up.        ER NOTE: Assessment and Plan    1. Vaginal bleeding in pregnancy, first trimester   2. Pregnancy of unknown anatomic location   3. Rh negative status during pregnancy in first trimester   4. [redacted] weeks gestation of pregnancy     -Reviewed bleeding & ectopic precautions -Message sent to Dr. Talbert [name] & Glorianne Manchester (per Dr. Sabra Heck) for follow up appointment next week   Jorje Guild, NP 03/29/22 1:47  Assessment and Plan    1. Vaginal bleeding in pregnancy, first trimester   2. Pregnancy of unknown anatomic location   3. Rh negative status during pregnancy in first trimester   4. [redacted] weeks gestation of pregnancy     -Reviewed bleeding & ectopic precautions -Message sent to Dr. Talbert [name] & Glorianne Manchester (per Dr. Sabra Heck) for follow up appointment next week   Jorje Guild, NP 03/29/22 1:47 PM

## 2022-03-31 NOTE — Telephone Encounter (Signed)
Patient is scheduled for f/u lab at [facility] 03/31/22 at 1110.  

## 2022-03-31 NOTE — Telephone Encounter (Signed)
Please send the quant stat so we have results for today. Thanks!

## 2022-03-31 NOTE — Telephone Encounter (Signed)
Placed order and indicated STAT result per Dr. Gentry Fitz request.

## 2022-03-31 NOTE — Progress Notes (Unsigned)
, °

## 2022-03-31 NOTE — Telephone Encounter (Signed)
Patient is scheduled for f/u lab at [facility] 03/31/22 at 1110.

## 2022-03-31 NOTE — Telephone Encounter (Signed)
I have a staff message from Dr. Talbert [name] "Talbert [name], Francesca Jewett, [place] T[facility] Please schedule her for a [facility] in the office on Wednesday.  Thanks,  Sharee Pimple "

## 2022-03-31 NOTE — Telephone Encounter (Signed)
Dr. Talbert [name] is in surgery until later this morning. I spoke with Rebecca [name]., NP and she gave me okay to order quant for today to help alleviate patient's anxiety.  I spoke with patient and she was very relieved to get to come in today for Quant. HCG.  Lab appt scheduled at 11 am.  Order placed.

## 2022-04-01 NOTE — Progress Notes (Signed)
Thanks for letting me know. No need to repeat before next week.

## 2022-04-08 ENCOUNTER — Other Ambulatory Visit: Payer: BC Managed Care – PPO

## 2022-04-08 DIAGNOSIS — O209 Hemorrhage in early pregnancy, unspecified: Secondary | ICD-10-CM

## 2022-04-09 LAB — HCG, SERUM, QUALITATIVE: Preg, Serum: NEGATIVE

## 2022-04-18 ENCOUNTER — Other Ambulatory Visit: Payer: Self-pay | Admitting: Nephrology

## 2022-04-18 DIAGNOSIS — Q612 Polycystic kidney, adult type: Secondary | ICD-10-CM

## 2022-05-06 ENCOUNTER — Ambulatory Visit
Admission: RE | Admit: 2022-05-06 | Discharge: 2022-05-06 | Disposition: A | Discharge status: Home / Self Care | Payer: BC Managed Care – PPO | Source: Ambulatory Visit | Attending: Nephrology | Admitting: Nephrology

## 2022-05-06 DIAGNOSIS — Q612 Polycystic kidney, adult type: Secondary | ICD-10-CM

## 2022-06-09 NOTE — L&D Delivery Note (Signed)
Operative Note  PROCEDURE DATE: 03/09/23   PREOPERATIVE DIAGNOSIS: PCKD, cHTN, Preeclampsia with severe range BP, nonreassuring FHT, IUGR with elevated dopplers.Frank breech.   POSTOPERATIVE DIAGNOSIS: The same   PROCEDURE:    Primary Low Transverse Cesarean Section   SURGEON:  Dr. Nilda Simmer Assistant: Dr. Sundra Aland  An experienced assistant was required given the standard of surgical care given the complexity of the case.  This assistant was needed for exposure, dissection, suctioning, retraction, instrument exchange, assisting with delivery with administration of fundal pressure, and for overall help during the procedure.     INDICATIONS: This is a 32 y.o. yo G2P0 at [redacted]w[redacted]d requiring cesarean section secondary to nonreassuring fetal heart tracing and elevated blood pressures. She was admitted to antepartum for management of cHTN with superimposed preeclampsia with severe features. This is in the setting of PCKD and IUGR with elevated dopplers.  On her AM monitoring, nonreassuring fetal heart tracing was noted, including more frequent variable decelerations, later decelerations, and minimal variability.   At this time, blood pressure also increased to the severe range, requiring treatment.  The case was discussed with MFM and were in agreement to move towards prompt delivery.  Decision made to proceed with LTCS given FHT and breech presentation. The risks of cesarean section discussed with the patient included but were not limited to: bleeding which may require transfusion or reoperation; infection which may require antibiotics; injury to bowel, bladder, ureters or other surrounding organs; injury to the fetus; need for additional procedures including hysterectomy in the event of a life-threatening hemorrhage; placental abnormalities wth subsequent pregnancies, incisional problems, thromboembolic phenomenon and other postoperative/anesthesia complications. The patient agreed with the  proposed plan, giving informed consent for the procedure.     FINDINGS:  Viable female infant in frank breech presentation, APGARs per NICU,  Weight pending, Amniotic fluid clear,  Intact small placenta, three vessel cord.  Grossly normal uterus. Cord blood attempted but very little flow. .   ANESTHESIA:    Epidural ESTIMATED BLOOD LOSS: pend SPECIMENS: Placenta for path (IUGR, preeclampsia COMPLICATIONS: None immediate    PROCEDURE IN DETAIL:  The patient received intravenous antibiotics (2g Ancef) and had sequential compression devices applied to her lower extremities while in the preoperative area.  She was then taken to the operating room where epidural anesthesia was dosed up to surgical level and was found to be adequate. She was then placed in a dorsal supine position with a leftward tilt, and prepped and draped in a sterile manner.  A foley catheter was placed into her bladder and attached to constant gravity.  After an adequate timeout was performed, a Pfannenstiel skin incision was made with scalpel and carried through to the underlying layer of fascia. The fascia was incised in the midline and this incision was extended bilaterally using the Mayo scissors. Kocher clamps were applied to the superior aspect of the fascial incision and the underlying rectus muscles were dissected off bluntly. A similar process was carried out on the inferior aspect of the facial incision. The rectus muscles were separated in the midline bluntly and the peritoneum was entered bluntly.  A bladder flap was created sharply and developed bluntly. The lower uterine segment seemed developed enough for a transverse uterine incision. A transverse hysterotomy was made with a scalpel and extended bilaterally bluntly. The bladder blade was then removed. The infant was successfully delivered via breech maneuvers, and cord was clamped and cut and infant was handed over to awaiting neonatology team. Uterine massage  was then  administered and the placenta delivered intact with three-vessel cord. Cord gases. The uterus was cleared of clot and debris.  The hysterotomy was closed with 0 vicryl.  A second imbricating suture of 0-vicryl was used to reinforce the incision and aid in hemostasis.The fascia was closed with 0-Vicryl in a running fashion with good restoration of anatomy.  The subcutaneus tissue was irrigated and was reapproximated using running plain gut.  The skin was closed with 4-0 Vicryl in a subcuticular fashion.  All surgical site and was hemostatic at end of procedure without any further bleeding on exam.    Pt tolerated the procedure well. All sponge/lap/needle counts were correct  X 2. Pt taken to recovery room in stable condition.     Nilda Simmer MD

## 2022-07-02 IMAGING — MG MM DIGITAL SCREENING BILAT W/ TOMO AND CAD
8 series · 9 of 24 positions shown · non-contrast
Comparison: None.

CLINICAL DATA: Screening.

EXAM:
DIGITAL SCREENING BILATERAL MAMMOGRAM WITH TOMOSYNTHESIS AND CAD
TECHNIQUE: Bilateral screening digital craniocaudal and mediolateral oblique
mammograms were obtained. Bilateral screening digital breast
tomosynthesis was performed. The images were evaluated with
computer-aided detection.

[L MLO synth-2D]
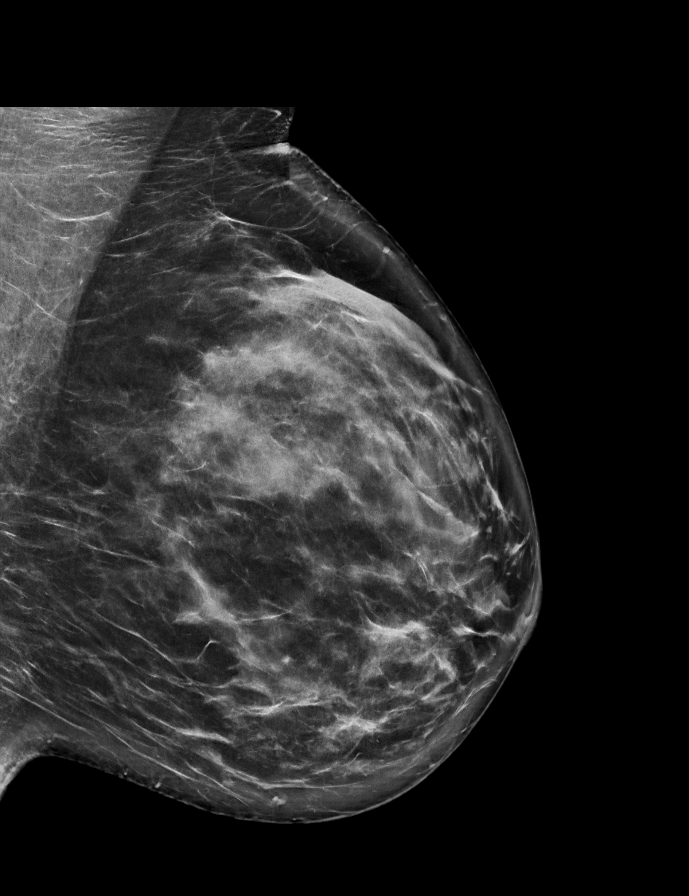

[R MLO synth-2D]
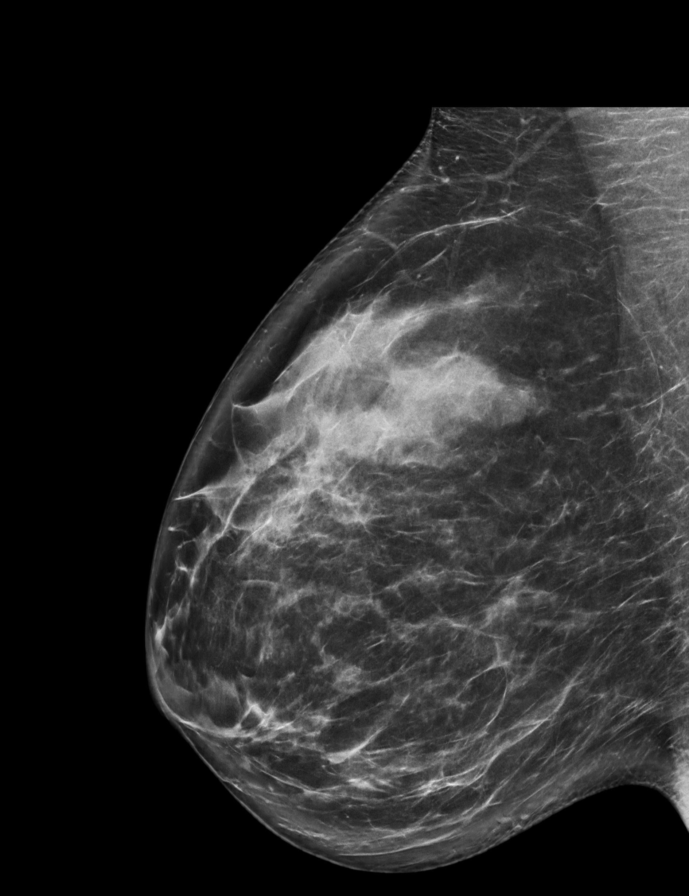

[L CC synth-2D]
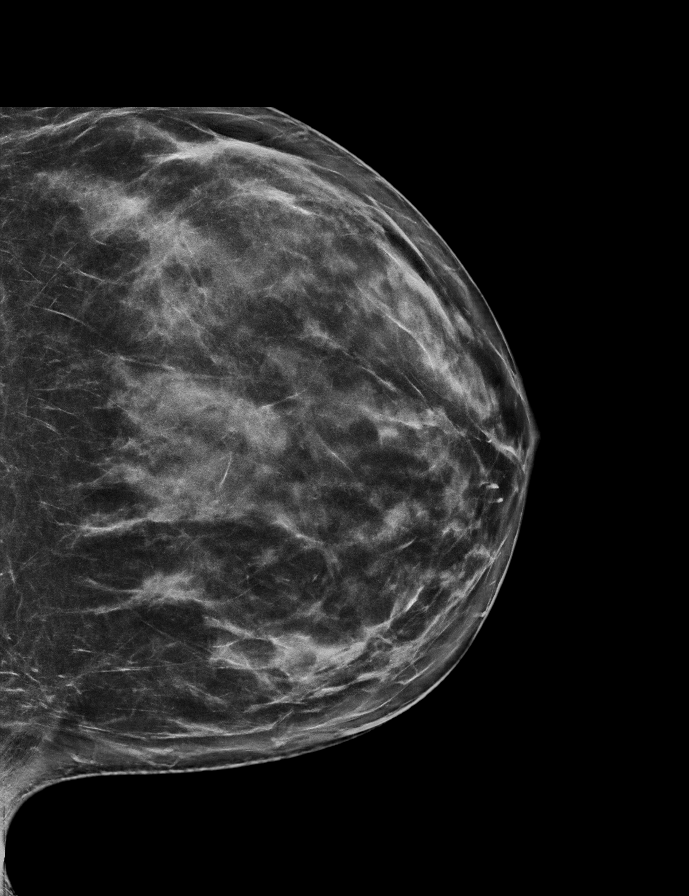

[R CC synth-2D]
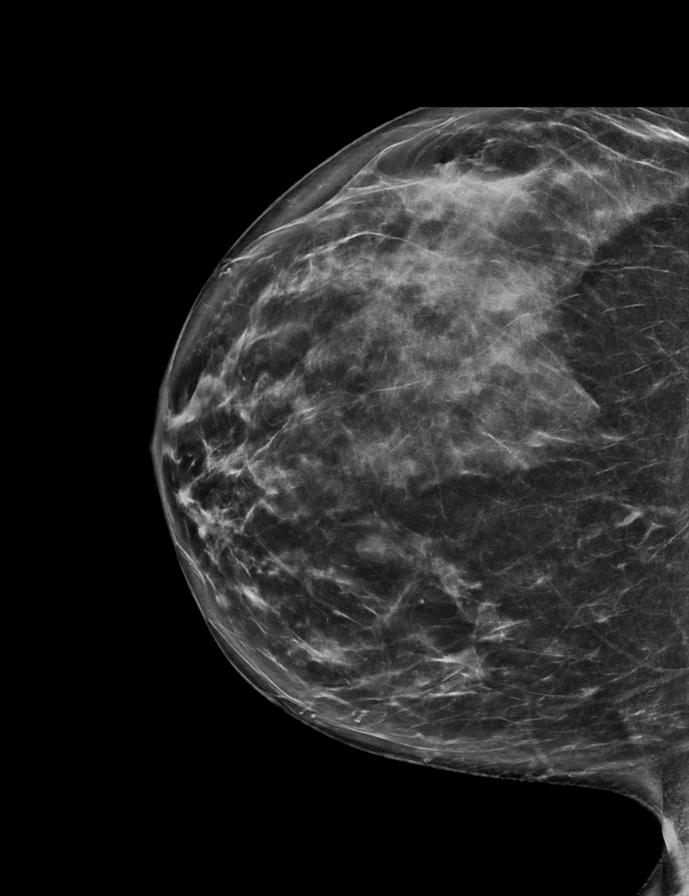

[L CC tomo · 2 of 75 frames shown]
[frame 25/75]
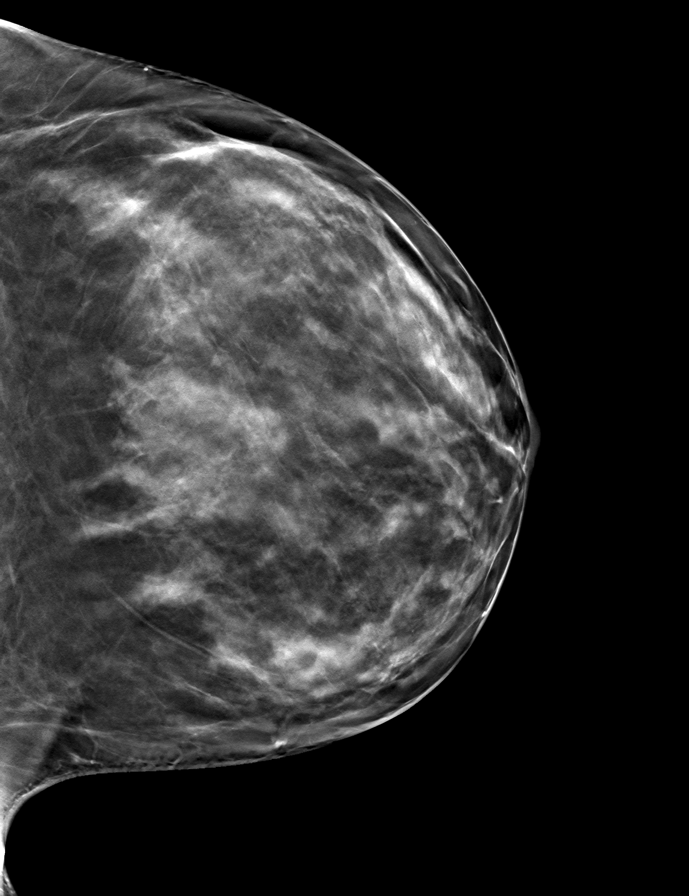
[frame 38/75]
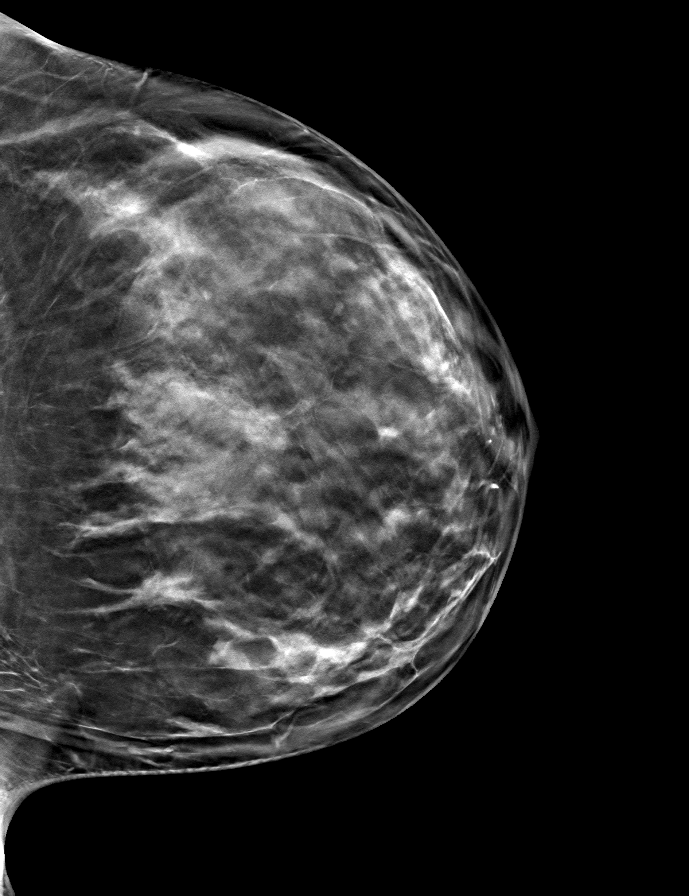

[L MLO tomo · tomo slice 44/87.0]
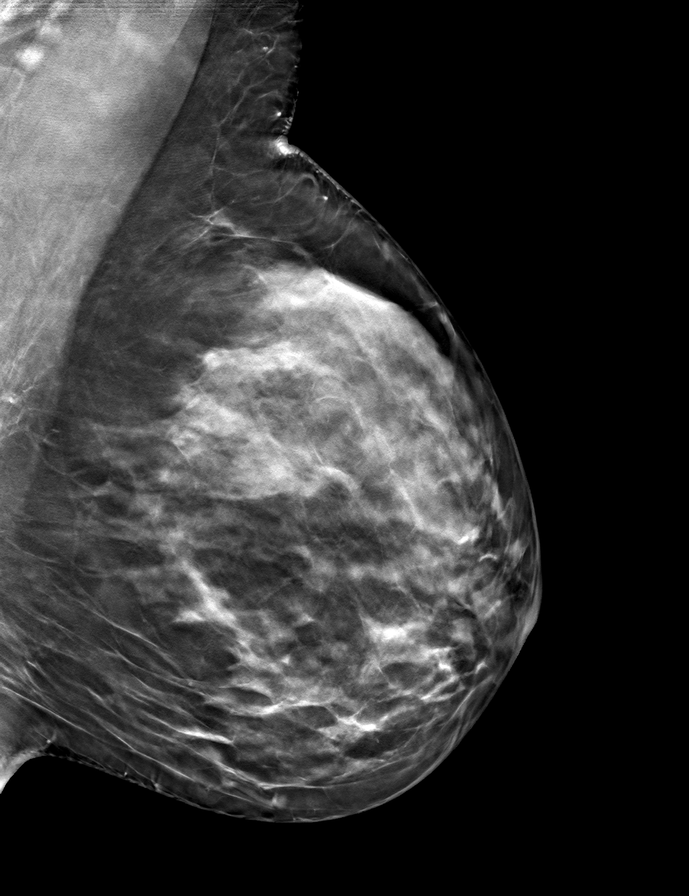

[R CC tomo · tomo slice 39/77.0]
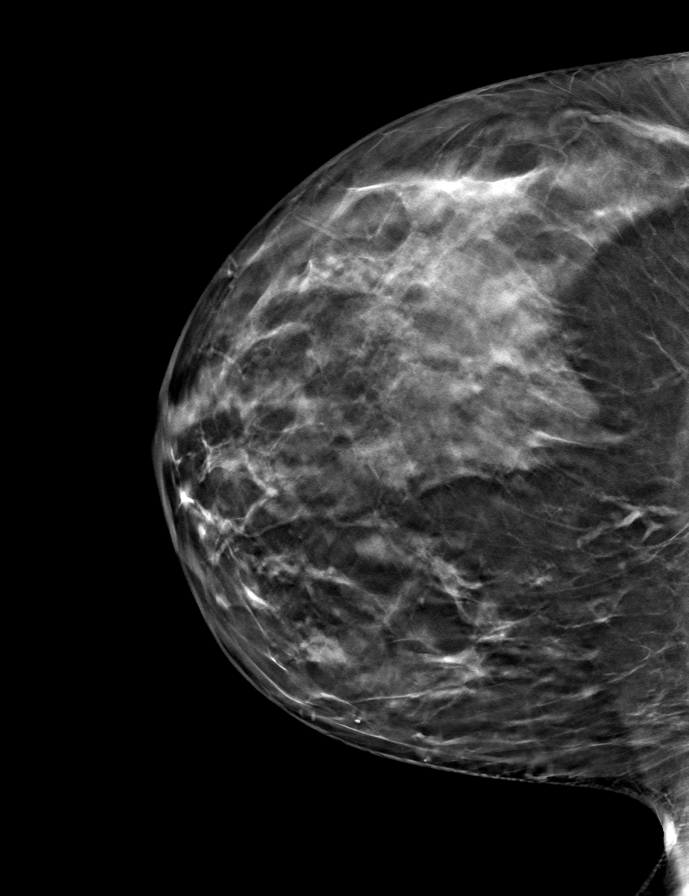

[R MLO tomo · tomo slice 43/84.0]
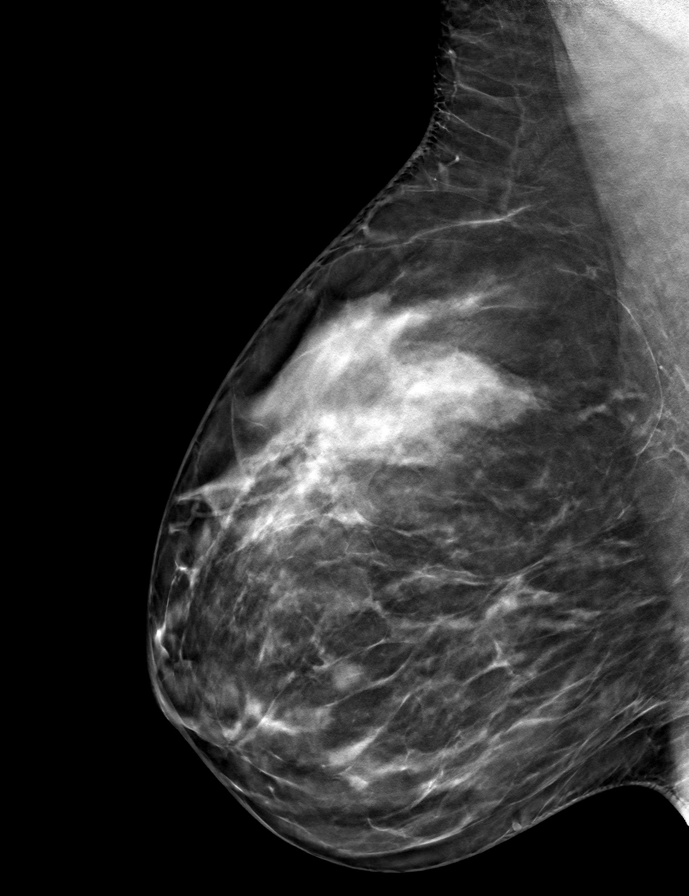

[9 of 24 positions shown; findings below may reference images not displayed]

ACR Breast Density Category c: The breast tissue is heterogeneously
dense, which may obscure small masses
FINDINGS: There are no findings suspicious for malignancy.
IMPRESSION: No mammographic evidence of malignancy. A result letter of this
screening mammogram will be mailed directly to the patient.

RECOMMENDATION:
1.  Screening mammogram in one year.(Code:[id number]-W-ZIJ)

2. Strong family history of breast cancer with sister recently
diagnosed with breast cancer at age 33. Recommend genetic counseling
if this has not already been performed. The [employer] Society
recommends annual MRI and mammography in patients with an estimated
lifetime risk of developing breast cancer greater than 20 - 25%, or
who are known or suspected to be positive for the breast cancer
gene.

BI-RADS CATEGORY  1: Negative.

## 2022-07-22 ENCOUNTER — Other Ambulatory Visit: Payer: Self-pay | Admitting: Internal Medicine

## 2022-07-22 DIAGNOSIS — Z1231 Encounter for screening mammogram for malignant neoplasm of breast: Secondary | ICD-10-CM

## 2022-07-30 ENCOUNTER — Ambulatory Visit: Payer: BC Managed Care – PPO | Admitting: Obstetrics and Gynecology

## 2022-08-01 ENCOUNTER — Ambulatory Visit: Payer: BC Managed Care – PPO | Admitting: Obstetrics and Gynecology

## 2022-08-26 ENCOUNTER — Ambulatory Visit: Payer: BC Managed Care – PPO | Admitting: Obstetrics and Gynecology

## 2022-09-02 ENCOUNTER — Ambulatory Visit
Admission: RE | Admit: 2022-09-02 | Discharge: 2022-09-02 | Disposition: A | Discharge status: Home / Self Care | Payer: BC Managed Care – PPO | Source: Ambulatory Visit | Attending: Internal Medicine | Admitting: Internal Medicine

## 2022-09-02 DIAGNOSIS — Z1231 Encounter for screening mammogram for malignant neoplasm of breast: Secondary | ICD-10-CM

## 2022-10-17 LAB — OB RESULTS CONSOLE HIV ANTIBODY (ROUTINE TESTING): HIV: NONREACTIVE

## 2022-10-17 LAB — OB RESULTS CONSOLE HGB/HCT, BLOOD
HCT: 39 (ref 29–41)
Hemoglobin: 12.7

## 2022-10-17 LAB — OB RESULTS CONSOLE RUBELLA ANTIBODY, IGM: Rubella: NON-IMMUNE/NOT IMMUNE

## 2022-10-17 LAB — OB RESULTS CONSOLE RPR: RPR: NONREACTIVE

## 2022-10-17 LAB — OB RESULTS CONSOLE ABO/RH: RH Type: NEGATIVE

## 2022-10-17 LAB — OB RESULTS CONSOLE HEPATITIS B SURFACE ANTIGEN: Hepatitis B Surface Ag: NEGATIVE

## 2022-10-17 LAB — OB RESULTS CONSOLE PLATELET COUNT: Platelets: 221

## 2022-10-29 ENCOUNTER — Encounter: Payer: Self-pay | Admitting: *Deleted

## 2022-10-29 ENCOUNTER — Other Ambulatory Visit: Payer: Self-pay

## 2022-10-29 DIAGNOSIS — O35EXX0 Maternal care for other (suspected) fetal abnormality and damage, fetal genitourinary anomalies, not applicable or unspecified: Secondary | ICD-10-CM | POA: Insufficient documentation

## 2022-10-29 DIAGNOSIS — O10919 Unspecified pre-existing hypertension complicating pregnancy, unspecified trimester: Secondary | ICD-10-CM | POA: Insufficient documentation

## 2022-10-30 ENCOUNTER — Ambulatory Visit: Payer: BC Managed Care – PPO | Admitting: *Deleted

## 2022-10-30 ENCOUNTER — Encounter: Payer: Self-pay | Admitting: *Deleted

## 2022-10-30 ENCOUNTER — Other Ambulatory Visit: Payer: Self-pay | Admitting: *Deleted

## 2022-10-30 ENCOUNTER — Ambulatory Visit: Payer: BC Managed Care – PPO | Attending: Obstetrics and Gynecology | Admitting: Maternal & Fetal Medicine

## 2022-10-30 DIAGNOSIS — O35EXX0 Maternal care for other (suspected) fetal abnormality and damage, fetal genitourinary anomalies, not applicable or unspecified: Secondary | ICD-10-CM

## 2022-10-30 DIAGNOSIS — O26891 Other specified pregnancy related conditions, first trimester: Secondary | ICD-10-CM | POA: Diagnosis not present

## 2022-10-30 DIAGNOSIS — Z3A01 Less than 8 weeks gestation of pregnancy: Secondary | ICD-10-CM | POA: Insufficient documentation

## 2022-10-30 DIAGNOSIS — Q613 Polycystic kidney, unspecified: Secondary | ICD-10-CM | POA: Diagnosis not present

## 2022-10-30 DIAGNOSIS — Q612 Polycystic kidney, adult type: Secondary | ICD-10-CM

## 2022-10-30 DIAGNOSIS — N281 Cyst of kidney, acquired: Secondary | ICD-10-CM | POA: Diagnosis present

## 2022-10-30 DIAGNOSIS — O352XX0 Maternal care for (suspected) hereditary disease in fetus, not applicable or unspecified: Secondary | ICD-10-CM | POA: Diagnosis not present

## 2022-10-30 DIAGNOSIS — Z3A09 9 weeks gestation of pregnancy: Secondary | ICD-10-CM | POA: Diagnosis not present

## 2022-10-30 MED ORDER — NIFEDIPINE ER OSMOTIC RELEASE 30 MG PO TB24: 30.0000 mg | ORAL_TABLET | Freq: Every day | ORAL | 5 refills | Status: DC

## 2022-10-30 NOTE — Progress Notes (Signed)
Patient information  Patient Name: Rebecca [name]  Patient MRN:   [id number]  Referring practice: MFM Referring Provider: [facility]  MFM CONSULT  Rebecca [name] is a 32 y.o. G2P0000 at [redacted]w[redacted]d here for consultation.   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 She reports she recently had confirmatory genetic testing for ADPKD and has the PKD2 gene mutation. She reports a family history of PKD in her full sister, mother, maternal aunt, maternal cousin, maternal grandmother, and maternal great aunt.  She is already been counseled about the clinical features of autosomal dominant polycystic kidney disease.  Per her report her kidney function has always been close to normal and she has never had imaging to detect extrarenal cysts.  She reports that none of her family members have extrarenal cysts except for her mom (liver).  Rebecca [name] has tried ibuprofen in the past and tolerated it well except for the most recent time she took an 800 mg prescription dose and developed angioedema.  I discussed that aspirin is in the similar drug classification but typically is well-tolerated even with people have an ibuprofen allergy.  Since she is at high risk for preeclampsia I think it is worth attempting aspirin.  I instructed her to take a Benadryl and go to the ER if she develops any concerning symptoms.  Rebecca [name] reports her blood pressure has been good at home.  She was on labetalol 100 mg twice daily but self discontinued after she developed shortness of breath and fatigue.  I discussed that these could be related to her labetalol exposure but it is difficult to tell.  Since there are other medications that are suitable in pregnancy I recommend she start Procardia if her blood pressure is persistently over 130/80.  I discussed the blood pressure goal is slightly lower for her due to her kidney disease.  She reports that she has never had an MRI of the brain or EKG or echo of her heart that  she is aware of.  I discussed the potential risk for cerebral aneurysms and importance of obtaining an MRI during pregnancy if possible.  Also discussed that we avoid gadolinium contrast due to increased risk of certain adverse effects in the fetus.  Therefore if contrast is necessary to obtain proper imaging it would be best to wait after delivery.  However, if contrast is not necessary then I prefer it be done during pregnancy.  Today her blood pressure is 130/81 and she feels well. Due to her longstanding hypertension recommend she have a baseline EKG and echocardiogram.  She understands the genetic inheritance pattern is autosomal dominant given her offspring a 50% chance of inheriting this condition. Genetic assessment of the fetus is only possible with amniocentesis if the maternal genotype is known. Alternatively, in the future donor egg with an unaffected mother would be a way to eliminate the risk knowing the offspring would not share the genetic makeup of the mother.  With ADPKD, it is common for patients with this disorder to be entirely asymptomatic, with normal blood pressure and preserved renal function, especially in the reproductive years. However, this is a progressive condition that typically presents with symptoms around age 17 to 31 years old, with the majority of patients developing hypertension even if renal function remains normal.  Autosomal dominant PKD is generally a later-onset multisystem disorder characterized by bilateral renal cysts, liver cysts, and an increased risk of intracranial aneurysms. Other clinical features of the condition include cysts in  the pancreas, seminal vesicles, and arachnoid membrane; dilatation of the aortic root and dissection of the thoracic aorta; mitral valve prolapse; and abdominal wall hernias. Renal manifestations include hypertension, renal pain, and renal insufficiency. Approximately 50% of individuals with autosomal dominant PKD have end-stage  renal disease by age 67 years.   Most pregnancy outcomes are favorable but there is an association with an increased risk of fetal growth restriction and preeclampsia.  Therefore, serial growth ultrasounds should be performed during the third trimester of the pregnancy.  Due to the association with preeclampsia, 81 mg of aspirin should taken during pregnancy daily after [redacted] weeks gestation. I discussed the utility of genetic counseling.  I also discussed that ADPKD can be diagnosed prenatally by ultrasound and should be considered among the differential diagnosis in the presence of bilateral enlarged, echogenic kidneys noted on fetal ultrasound.  Recommendations  - I reassured the patient that in the absence of uncontrolled hypertension or cerebral aneurysms, pregnancy is generally well tolerated with this condition. - MRI of the brain to rule out cerebral aneurysm is recommended.  If gadolinium contrast is necessary then this should be postponed until after delivery.   - Detailed anatomic survey to be done at 19 weeks.  - Aspirin 81 mg daily after 12 weeks for preeclampsia risk reduction - Baseline labs for preeclampsia if not previously done - Start Procardia if BP is > 130/80.  - Maternal echo and EKG due to long-standing hypertension  - [facility] and amnio was offered. The patient and her husband are uncertain if they would like this. They would not terminate over this diagnosis.  - Genetic counseling was previously completed.  - Serial growth ultrasounds every 4 weeks starting around 28-32 weeks  Review of Systems: A review of systems was performed and was negative except per HPI   Vitals and Physical Exam    10/30/2022   10:48 AM 03/29/2022    1:47 PM 03/29/2022   12:09 PM  Vitals with BMI  Height   5\' 0"   Systolic 130 136 562  Diastolic 82 96 90  Pulse 96 84 85  Sitting comfortably in the office chair Nonlabored breathing Normal rate and rhythm Abdomen is nontender  Past  pregnancies OB History  Gravida Para Term Preterm AB Living  2 0 0 0 0 0  SAB IAB Ectopic Multiple Live Births  0 0 0 0      # Outcome Date GA Lbr Len/2nd Weight Sex Delivery Anes PTL Lv  2 Current           1 Gravida             I spent 60 minutes reviewing the patients chart, including labs and images as well as counseling the patient about her medical conditions. Greater than 50% of the time was spent in direct face-to-face patient counseling.  Braxton Feathers  MFM, [facility]   10/30/2022  10:52 AM

## 2022-10-31 LAB — OB RESULTS CONSOLE GC/CHLAMYDIA
Chlamydia: NEGATIVE
Neisseria Gonorrhea: NEGATIVE

## 2022-11-03 ENCOUNTER — Other Ambulatory Visit: Payer: Self-pay

## 2022-11-05 ENCOUNTER — Other Ambulatory Visit: Payer: Self-pay | Admitting: Obstetrics & Gynecology

## 2022-11-05 DIAGNOSIS — Q612 Polycystic kidney, adult type: Secondary | ICD-10-CM

## 2022-11-06 ENCOUNTER — Encounter: Payer: Self-pay | Admitting: Obstetrics & Gynecology

## 2022-11-08 ENCOUNTER — Other Ambulatory Visit: Payer: BC Managed Care – PPO

## 2022-11-10 ENCOUNTER — Ambulatory Visit
Admission: RE | Admit: 2022-11-10 | Discharge: 2022-11-10 | Disposition: A | Discharge status: Home / Self Care | Payer: BC Managed Care – PPO | Source: Ambulatory Visit | Attending: Obstetrics & Gynecology | Admitting: Obstetrics & Gynecology

## 2022-11-10 DIAGNOSIS — Q612 Polycystic kidney, adult type: Secondary | ICD-10-CM

## 2022-11-19 ENCOUNTER — Inpatient Hospital Stay (HOSPITAL_COMMUNITY): Payer: BC Managed Care – PPO

## 2022-11-19 ENCOUNTER — Inpatient Hospital Stay (HOSPITAL_COMMUNITY)
Admission: AD | Admit: 2022-11-19 | Discharge: 2022-11-19 | Disposition: A | Discharge status: Home / Self Care | Payer: BC Managed Care – PPO | Attending: Obstetrics and Gynecology | Admitting: Obstetrics and Gynecology

## 2022-11-19 ENCOUNTER — Encounter (HOSPITAL_COMMUNITY): Payer: Self-pay | Admitting: Obstetrics and Gynecology

## 2022-11-19 DIAGNOSIS — O10011 Pre-existing essential hypertension complicating pregnancy, first trimester: Secondary | ICD-10-CM | POA: Diagnosis not present

## 2022-11-19 DIAGNOSIS — N939 Abnormal uterine and vaginal bleeding, unspecified: Secondary | ICD-10-CM

## 2022-11-19 DIAGNOSIS — Z3A13 13 weeks gestation of pregnancy: Secondary | ICD-10-CM | POA: Insufficient documentation

## 2022-11-19 DIAGNOSIS — Z23 Encounter for immunization: Secondary | ICD-10-CM | POA: Insufficient documentation

## 2022-11-19 DIAGNOSIS — Z3A12 12 weeks gestation of pregnancy: Secondary | ICD-10-CM

## 2022-11-19 DIAGNOSIS — O209 Hemorrhage in early pregnancy, unspecified: Secondary | ICD-10-CM | POA: Diagnosis not present

## 2022-11-19 HISTORY — DX: Unspecified abnormal cytological findings in specimens from vagina: R87.629

## 2022-11-19 HISTORY — DX: Essential (primary) hypertension: I10

## 2022-11-19 LAB — URINALYSIS, ROUTINE W REFLEX MICROSCOPIC
Bilirubin Urine: NEGATIVE
Glucose, UA: 50 mg/dL — AB
Ketones, ur: NEGATIVE mg/dL
Leukocytes,Ua: NEGATIVE
Nitrite: NEGATIVE
Protein, ur: NEGATIVE mg/dL
RBC / HPF: >50 RBC/hpf (ref 0–5)
Specific Gravity, Urine: 1.018 (ref 1.005–1.030)
pH: 6 (ref 5.0–8.0)

## 2022-11-19 LAB — WET PREP, GENITAL
Clue Cells Wet Prep HPF POC: NONE SEEN
Sperm: NONE SEEN
Trich, Wet Prep: NONE SEEN
WBC, Wet Prep HPF POC: <10 (ref <10)
Yeast Wet Prep HPF POC: NONE SEEN

## 2022-11-19 MED ORDER — RHO D IMMUNE GLOBULIN 1500 UNIT/2ML IJ SOSY
300.0000 ug | PREFILLED_SYRINGE | Freq: Once | INTRAMUSCULAR | Status: AC
  Administered 2022-11-19: 300 ug via INTRAMUSCULAR
  Filled 2022-11-19: qty 2

## 2022-11-19 MED ORDER — ACETAMINOPHEN 500 MG PO TABS
1000.0000 mg | ORAL_TABLET | Freq: Once | ORAL | Status: AC
  Administered 2022-11-19: 1000 mg via ORAL
  Filled 2022-11-19: qty 2

## 2022-11-19 NOTE — MAU Note (Signed)
Rebecca [name] is a 32 y.o. at [redacted]w[redacted]d here in MAU reporting: about an hour ago when she went to the bathroom, she saw 'a lot of blood' when she wiped,  has a pad on and can feel wetness. Not really in any pain, kind of felt gassy earlier.  Has been constipated, had a hard bm this morning. hx of some brown d/c at 6/7 wks.  Denies recent intercourse  Onset of complaint: this morning Pain score: 0 Vitals:   11/19/22 1532 11/19/22 1537  BP: (!) 151/96 (!) 155/87  Pulse: (!) 106 91  Resp: 17   Temp: 97.9 F (36.6 C)   SpO2: 100%      FHT:154 Lab orders placed from triage:     Says BP has been high, has medicine that she "is to satart when consistently high".

## 2022-11-19 NOTE — MAU Provider Note (Addendum)
MAU Provider Note  History  [id number]  Arrival date and time: 11/19/22 1516   Chief Complaint  Patient presents with   Vaginal Bleeding    Rebecca [name] is a 32 y.o. G2P0010 at [redacted]w[redacted]d by LMP with PMHx notable for [facility], who presents for vaginal bleeding.  Patient was she saw a lot of blood when she was in the bathroom approximately an hour before arrival.  She notes her pad feels wet.  Declines pain.  Admits constipation, but has not been straining and her bowel movements have been regular.  Declines recent sexual intercourse.  She does note abnormal discharge starting about a week ago, but no vaginal irritation/itching or pain on urination.   Vaginal bleeding: Yes LOF: No Fetal Movement: No Contractions: No  --/--/O NEG (10/21 1207)  OB History  Gravida Para Term Preterm AB Living  2 0 0 0 1 0  SAB IAB Ectopic Multiple Live Births  1 0 0 0      # Outcome Date GA Lbr Len/2nd Weight Sex Delivery Anes PTL Lv  2 Current           1 SAB      SAB        Past Medical History:  Diagnosis Date   Amenorrhea    Anxiety    Asthma    Asthma 07/24/2021   Atopic dermatitis 07/24/2021   Family history of breast cancer 07/26/2020   Family history of breast cancer 07/26/2020   Generalized anxiety disorder 07/24/2021   Hypertension    Irritable bowel syndrome with diarrhea 07/24/2021   Migraine headache without aura 11/03/2014   Migraine without aura    Migraine without aura    Mild intermittent asthma 07/24/2021   Ovarian cyst    Ovarian cyst    Polycystic kidney disease    UTI (urinary tract infection)    Vaginal Pap smear, abnormal     Past Surgical History:  Procedure Laterality Date   WISDOM TOOTH EXTRACTION  2012/2013    Family History  Problem Relation Age of Onset   Kidney disease Mother    Hypertension Mother    Polycystic kidney disease Mother    Hypertension Father    Hypertension Sister    Breast cancer Sister 31       invasive ductal cancer, negative genetic  testing   Diabetes Paternal Uncle    Heart attack Maternal Grandfather    Brain cancer Paternal Grandmother 46   Diabetes Paternal Grandfather    Heart attack Paternal Grandfather    Stomach cancer Paternal Grandfather 9   Breast cancer Other 83       MGM's sister   Prostate cancer Other        MGM's brother    Social History   Socioeconomic History   Marital status: Married    Spouse name: Not on file   Number of children: Not on file   Years of education: Not on file   Highest education level: Not on file  Occupational History   Not on file  Tobacco Use   Smoking status: Never   Smokeless tobacco: Never  Vaping Use   Vaping Use: Never used  Substance and Sexual Activity   Alcohol use: No    Alcohol/week: 0.0 standard drinks of alcohol   Drug use: No   Sexual activity: Yes    Partners: Male  Other Topics Concern   Not on file  Social History Narrative   Not on file   Social  Determinants of Health   Financial Resource Strain: Not on file  Food Insecurity: Not on file  Transportation Needs: Not on file  Physical Activity: Not on file  Stress: Not on file  Social Connections: Not on file  Intimate Partner Violence: Not on file    Allergies  Allergen Reactions   Ibuprofen Anaphylaxis    Angioedema    No current facility-administered medications on file prior to encounter.   Current Outpatient Medications on File Prior to Encounter  Medication Sig Dispense Refill   metoCLOPramide (REGLAN) 10 MG tablet Take 10 mg by mouth at bedtime.     ondansetron (ZOFRAN-ODT) 4 MG disintegrating tablet Take 4 mg by mouth every 8 (eight) hours as needed for nausea or vomiting.     Prenatal Vit-Fe Fumarate-FA (MULTIVITAMIN-PRENATAL) 27-0.8 MG TABS tablet Take 1 tablet by mouth daily at 12 noon.     albuterol (VENTOLIN HFA) 108 (90 Base) MCG/ACT inhaler Inhale 1 puff into the lungs 4 (four) times daily as needed. (Patient not taking: Reported on 10/30/2022)     NIFEdipine  (PROCARDIA XL) 30 MG 24 hr tablet Take 1 tablet (30 mg total) by mouth daily. 30 tablet 5    ROS: Pertinent positives and negative per HPI, all others reviewed and negative  Physical Exam   BP (!) 155/87 (BP Location: Right Arm)   Pulse 91   Temp 97.9 F (36.6 C) (Oral)   Resp 17   Ht 5' (1.524 m)   Wt 79.4 kg   LMP 08/18/2022   SpO2 100%   BMI 34.20 kg/m   Patient Vitals for the past 24 hrs:  BP Temp Temp src Pulse Resp SpO2 Height Weight  11/19/22 1537 (!) 155/87 -- -- 91 -- -- -- --  11/19/22 1532 (!) 151/96 97.9 F (36.6 C) Oral (!) 106 17 100 % 5' (1.524 m) 79.4 kg    Physical Exam Vitals reviewed. Exam conducted with a chaperone present.  Constitutional:      Appearance: Normal appearance.  HENT:     Head: Normocephalic and atraumatic.     Right Ear: External ear normal.     Left Ear: External ear normal.  Cardiovascular:     Rate and Rhythm: Normal rate and regular rhythm.  Pulmonary:     Effort: Pulmonary effort is normal.     Breath sounds: Normal breath sounds.  Abdominal:     General: Abdomen is flat.     Palpations: Abdomen is soft.  Genitourinary:    Exam position: Lithotomy position.     Vagina: Normal.     Cervix: Cervical bleeding present. No cervical motion tenderness, friability or erythema.     Rectum: Normal.     Comments: Bright red blood on labia Skin:    General: Skin is warm.     Capillary Refill: Capillary refill takes less than 2 seconds.  Psychiatric:        Mood and Affect: Mood normal.     Cervical Exam    Bedside Ultrasound Done. Pt informed that the ultrasound is considered a limited OB ultrasound and is not intended to be a complete ultrasound exam.  Patient also informed that the ultrasound is not being completed with the intent of assessing for fetal or placental anomalies or any pelvic abnormalities.  Explained that the purpose of today's ultrasound is to assess for  viability.  Patient acknowledges the purpose of the exam  and the limitations of the study.    FHT 154 bpm per  doppler  Labs Results for orders placed or performed during the hospital encounter of 11/19/22 (from the past 24 hour(s))  Wet prep, genital     Status: None   Collection Time: 11/19/22  4:11 PM   Specimen: Vaginal  Result Value Ref Range   Yeast Wet Prep HPF POC NONE SEEN NONE SEEN   Trich, Wet Prep NONE SEEN NONE SEEN   Clue Cells Wet Prep HPF POC NONE SEEN NONE SEEN   WBC, Wet Prep HPF POC <10 <10   Sperm NONE SEEN   Urinalysis, Routine w reflex microscopic -Urine, Clean Catch     Status: Abnormal   Collection Time: 11/19/22  4:25 PM  Result Value Ref Range   Color, Urine YELLOW YELLOW   APPearance HAZY (A) CLEAR   Specific Gravity, Urine 1.018 1.005 - 1.030   pH 6.0 5.0 - 8.0   Glucose, UA 50 (A) NEGATIVE mg/dL   Hgb urine dipstick LARGE (A) NEGATIVE   Bilirubin Urine NEGATIVE NEGATIVE   Ketones, ur NEGATIVE NEGATIVE mg/dL   Protein, ur NEGATIVE NEGATIVE mg/dL   Nitrite NEGATIVE NEGATIVE   Leukocytes,Ua NEGATIVE NEGATIVE   RBC / HPF >50 0 - 5 RBC/hpf   WBC, UA 0-5 0 - 5 WBC/hpf   Bacteria, UA RARE (A) NONE SEEN   Squamous Epithelial / HPF 0-5 0 - 5 /HPF   Mucus PRESENT     Imaging US OB Comp Less 14 Wks  Result Date: 11/19/2022 CLINICAL DATA:  Vaginal bleeding. Estimated gestational age of thirteen weeks, 2 days by LMP. EXAM: OBSTETRIC <14 WK ULTRASOUND TECHNIQUE: Transabdominal ultrasound was performed for evaluation of the gestation as well as the maternal uterus and adnexal regions. COMPARISON:  None Available. FINDINGS: Intrauterine gestational sac: Single. Yolk sac:  Not Visualized. Embryo:  Visualized. Cardiac Activity: Visualized. Heart Rate: 157 bpm CRL:   67.2 mm   13 w 0 d                  [place] EDC: 05/27/2023 Subchorionic hemorrhage:  None visualized. Maternal uterus/adnexae: The uterus and right ovary are unremarkable. The left ovary is not visualized. No free fluid in the pelvis. IMPRESSION: 1. Single live  intrauterine pregnancy with estimated gestational age of [redacted] weeks, 0 days. No acute abnormality. Electronically Signed   By: Obie Dredge M.D.   On: 11/19/2022 17:10    MAU Course  MDM: moderate  This patient presents to the ED for concern of   Chief Complaint  Patient presents with   Vaginal Bleeding     These complains involves an extensive number of treatment options, and is a complaint that carries with it a high risk of complications and morbidity.  The differential diagnosis for  1. Vaginal Bleeding INCLUDES pregnancy related, miscarriage, subchorionic hemorrhage, placental abruption, infection.   Co morbidities that complicate the patient evaluation:  Additional history obtained from partner  Interpreter services used: not applicable  External records from outside source obtained and reviewed including Scanned media records  Lab Tests: UA, Wet prep, and Gonorrhea/Chlamydia   I ordered, and personally interpreted labs.  The pertinent results include: Urinalysis, wet prep  Imaging Studies ordered:  I ordered imaging studies includingOther [place] <14 wks  Cardiac Testing/Monitoring:  EKG was not ordered today. I personally reviewed or consulted with a physician to help with intrepretation.  Medicines ordered and prescription drug management:  Medications: N/A   Reevaluation of the patient after these medicines showed that the patient improved I have  reviewed the patients home medicines and have made adjustments as needed  Test Considered: N/A  Critical Interventions: N/a  Consultations Obtained: N/A  MAU Course: -Pt seen, speculum exam with bright red blood per os and clot in vaginal vault. Swabs collected. [place] ordered.  - US unremarkable, wet prep negative  After the interventions noted above, I reevaluated the patient and found that they have :improved   Dispostion: discharged   Assessment and Plan  1. Vagina bleeding - Discharge patient  2. [redacted] weeks  gestation of pregnancy - Discharge patient   Patient seen for vaginal bleeding.  Swabs collected, GC pending.  MAU course as above.  Ultrasound unremarkable without placental abnormality or subchronic hemorrhage. Gave return and preterm labor precautions.  Follow-up with primary OB.  Discharge Instructions     Discharge patient   Complete by: As directed    Discharge disposition: 01-Home or Self Care   Discharge patient date: 11/19/2022      Allergies as of 11/19/2022       Reactions   Ibuprofen Anaphylaxis   Angioedema        Medication List     TAKE these medications    albuterol 108 (90 Base) MCG/ACT inhaler Commonly known as: VENTOLIN HFA Inhale 1 puff into the lungs 4 (four) times daily as needed.   metoCLOPramide 10 MG tablet Commonly known as: REGLAN Take 10 mg by mouth at bedtime.   multivitamin-prenatal 27-0.8 MG Tabs tablet Take 1 tablet by mouth daily at 12 noon.   NIFEdipine 30 MG 24 hr tablet Commonly known as: Procardia XL Take 1 tablet (30 mg total) by mouth daily.   ondansetron 4 MG disintegrating tablet Commonly known as: ZOFRAN-ODT Take 4 mg by mouth every 8 (eight) hours as needed for nausea or vomiting.        Myrtie Hawk, DO FMOB Fellow, Faculty practice [facility], Center for [facility] 11/19/22  5:30 PM   Attestation of Attending Supervision of Obstetric Fellow: Evaluation and management procedures were performed by the Obstetric Fellow under my supervision and collaboration.  I have reviewed the Obstetric Fellow's note and chart, and I agree with the management and plan except as noted below.  Rh neg, given rhogam  Venora Maples, MD Family Medicine Attending, [facility] for [facility], [facility] 11/20/2022 8:29 AM

## 2022-11-20 LAB — GC/CHLAMYDIA PROBE AMP (~~LOC~~) NOT AT ARMC
Chlamydia: NEGATIVE
Comment: NEGATIVE
Comment: NORMAL
Neisseria Gonorrhea: NEGATIVE

## 2022-11-20 LAB — RH IG WORKUP (INCLUDES ABO/RH)
ABO/RH(D): O NEG
Antibody Screen: NEGATIVE
Gestational Age(Wks): 12
Unit division: 0

## 2022-11-26 ENCOUNTER — Other Ambulatory Visit: Payer: Self-pay | Admitting: Obstetrics & Gynecology

## 2022-11-26 DIAGNOSIS — Q612 Polycystic kidney, adult type: Secondary | ICD-10-CM

## 2022-12-04 ENCOUNTER — Other Ambulatory Visit (HOSPITAL_COMMUNITY): Payer: Self-pay | Admitting: Obstetrics and Gynecology

## 2022-12-04 ENCOUNTER — Ambulatory Visit
Admission: RE | Admit: 2022-12-04 | Discharge: 2022-12-04 | Disposition: A | Discharge status: Home / Self Care | Payer: BC Managed Care – PPO | Source: Ambulatory Visit | Attending: Obstetrics & Gynecology | Admitting: Obstetrics & Gynecology

## 2022-12-04 DIAGNOSIS — Q612 Polycystic kidney, adult type: Secondary | ICD-10-CM

## 2022-12-04 DIAGNOSIS — I10 Essential (primary) hypertension: Secondary | ICD-10-CM

## 2022-12-29 DIAGNOSIS — O99210 Obesity complicating pregnancy, unspecified trimester: Secondary | ICD-10-CM | POA: Insufficient documentation

## 2022-12-30 NOTE — Progress Notes (Signed)
Cardio-Obstetrics Clinic  New Evaluation  Date:  01/02/2023   ID:  Mahkayla Preece, DOB 01-13-1991, MRN [id number]  PCP:  Lorenda Ishihara, MD   [facility] Providers Cardiologist:  None  Electrophysiologist:  None       Referring MD: Mitchel Honour, DO   Chief Complaint: HTN  History of Present Illness:    Shelise Maron is a 32 y.o. female [G2P0010] who is being seen today for the evaluation of HTN and polycystic kidney disease at the request of Morris, Megan, DO.   The patient is currently [redacted]w[redacted]d pregnant. Has underlying polycystic kidney disease and has developed HTN in this setting. Follows regularly with Nephrology. Has been able to be off medication after weight loss. She is currently pregnant and doing well. No chest pain, SOB, orthopnea, or PND. Has trace LE edema that is worse at the end of the day. Blood pressure is 120s/70-80s off medication. Has nifedipine if needed if BP sustains  >mid-130/90s.    Prior CV Studies Reviewed: The following studies were reviewed today: Cardiac Studies & Procedures       ECHOCARDIOGRAM  ECHOCARDIOGRAM COMPLETE 01/02/2023  Narrative ECHOCARDIOGRAM REPORT    Patient Name:   CANNIE MUCKLE Date of Exam: 01/02/2023 Medical Rec #:  [id number]    Height:       60.0 in Accession #:    [id number]   Weight:       175.1 lb Date of Birth:  [birth year]     BSA:          1.764 m Patient Age:    31 years     BP:           124/67 mmHg Patient Gender: F            HR:           84 bpm. Exam Location:  Outpatient  Procedure: 2D Echo, Color Doppler and Cardiac Doppler  Indications:    Essential Primary Hypertension  History:        Patient has no prior history of Echocardiogram examinations. Polycystic Kidney Disease.  Sonographer:    L. Thornton-Maynard Referring Phys: [id number] MICHELLE GREWAL  IMPRESSIONS   1. Left ventricular ejection fraction, by estimation, is 60 to 65%. The left ventricle has normal function. The left ventricle  has no regional wall motion abnormalities. Left ventricular diastolic parameters were normal. 2. Right ventricular systolic function is normal. The right ventricular size is normal. 3. The mitral valve is normal in structure. Trivial mitral valve regurgitation. No evidence of mitral stenosis. 4. The aortic valve is tricuspid. Aortic valve regurgitation is trivial. No aortic stenosis is present. 5. The inferior vena cava is normal in size with greater than 50% respiratory variability, suggesting right atrial pressure of 3 mmHg.  Comparison(s): No prior Echocardiogram.  FINDINGS Left Ventricle: Left ventricular ejection fraction, by estimation, is 60 to 65%. The left ventricle has normal function. The left ventricle has no regional wall motion abnormalities. The left ventricular internal cavity size was normal in size. There is no left ventricular hypertrophy. Left ventricular diastolic parameters were normal.  Right Ventricle: The right ventricular size is normal. Right ventricular systolic function is normal.  Left Atrium: Left atrial size was normal in size.  Right Atrium: Right atrial size was normal in size.  Pericardium: There is no evidence of pericardial effusion.  Mitral Valve: The mitral valve is normal in structure. Trivial mitral valve regurgitation. No evidence of mitral valve stenosis.  Tricuspid Valve: The  tricuspid valve is normal in structure. Tricuspid valve regurgitation is trivial. No evidence of tricuspid stenosis.  Aortic Valve: The aortic valve is tricuspid. Aortic valve regurgitation is trivial. No aortic stenosis is present. Aortic valve mean gradient measures 5.0 mmHg. Aortic valve peak gradient measures 8.1 mmHg. Aortic valve area, by VTI measures 1.53 cm.  Pulmonic Valve: The pulmonic valve was normal in structure. Pulmonic valve regurgitation is trivial. No evidence of pulmonic stenosis.  Aorta: The aortic root is normal in size and structure.  Venous: The  inferior vena cava is normal in size with greater than 50% respiratory variability, suggesting right atrial pressure of 3 mmHg.  IAS/Shunts: No atrial level shunt detected by color flow Doppler.   LEFT VENTRICLE PLAX 2D LVIDd:         4.50 cm     Diastology LVIDs:         2.43 cm     LV e' medial:    11.90 cm/s LV PW:         0.70 cm     LV E/e' medial:  6.0 LV IVS:        0.75 cm     LV e' lateral:   18.50 cm/s LVOT diam:     1.80 cm     LV E/e' lateral: 3.9 LV SV:         42 LV SV Index:   24 LVOT Area:     2.54 cm  LV Volumes (MOD) LV vol d, MOD A2C: 72.9 ml LV vol d, MOD A4C: 63.1 ml LV vol s, MOD A2C: 20.2 ml LV vol s, MOD A4C: 18.8 ml LV SV MOD A2C:     52.7 ml LV SV MOD A4C:     63.1 ml LV SV MOD BP:      49.6 ml  RIGHT VENTRICLE             IVC RV Basal diam:  2.60 cm     IVC diam: 1.30 cm RV S prime:     15.90 cm/s TAPSE (M-mode): 2.5 cm  LEFT ATRIUM             Index        RIGHT ATRIUM          Index LA diam:        2.90 cm 1.64 cm/m   RA Area:     9.17 cm LA Vol (A2C):   40.6 ml 23.02 ml/m  RA Volume:   15.90 ml 9.01 ml/m LA Vol (A4C):   43.6 ml 24.72 ml/m LA Biplane Vol: 41.8 ml 23.70 ml/m AORTIC VALVE                    PULMONIC VALVE AV Area (Vmax):    1.85 cm     PV Vmax:          1.04 m/s AV Area (Vmean):   1.64 cm     PV Peak grad:     4.3 mmHg AV Area (VTI):     1.53 cm     PR End Diast Vel: 3.20 msec AV Vmax:           142.00 cm/s AV Vmean:          98.500 cm/s AV VTI:            0.273 m AV Peak Grad:      8.1 mmHg AV Mean Grad:      5.0 mmHg LVOT Vmax:  103.00 cm/s LVOT Vmean:        63.300 cm/s LVOT VTI:          0.164 m LVOT/AV VTI ratio: 0.60  AORTA Ao Root diam: 2.40 cm Ao Asc diam:  2.70 cm  MV E velocity: 71.50 cm/s MV A velocity: 50.40 cm/s  SHUNTS MV E/A ratio:  1.42        Systemic VTI:  0.16 m Systemic Diam: 1.80 cm  Olga Millers MD Electronically signed by Olga Millers MD Signature Date/Time:  01/02/2023/3:14:01 PM    Final              Past Medical History:  Diagnosis Date   Amenorrhea    Anxiety    Asthma    Asthma 07/24/2021   Atopic dermatitis 07/24/2021   Family history of breast cancer 07/26/2020   Family history of breast cancer 07/26/2020   Generalized anxiety disorder 07/24/2021   Hypertension    Irritable bowel syndrome with diarrhea 07/24/2021   Migraine headache without aura 11/03/2014   Migraine without aura    Migraine without aura    Mild intermittent asthma 07/24/2021   Ovarian cyst    Ovarian cyst    Polycystic kidney disease    UTI (urinary tract infection)    Vaginal Pap smear, abnormal     Past Surgical History:  Procedure Laterality Date   WISDOM TOOTH EXTRACTION  2012/2013    OB History     Gravida  2   Para  0   Term  0   Preterm  0   AB  1   Living  0      SAB  1   IAB  0   Ectopic  0   Multiple  0   Live Births               Current Medications: Current Meds  Medication Sig   albuterol (VENTOLIN HFA) 108 (90 Base) MCG/ACT inhaler Inhale 1 puff into the lungs 4 (four) times daily as needed.   aspirin EC 81 MG tablet Take 81 mg by mouth daily. Swallow whole.   metoCLOPramide (REGLAN) 10 MG tablet Take 10 mg by mouth at bedtime.   NIFEdipine (PROCARDIA XL) 30 MG 24 hr tablet Take 1 tablet (30 mg total) by mouth daily.   ondansetron (ZOFRAN-ODT) 4 MG disintegrating tablet Take 4 mg by mouth every 8 (eight) hours as needed for nausea or vomiting.   Prenatal Vit-Fe Fumarate-FA (MULTIVITAMIN-PRENATAL) 27-0.8 MG TABS tablet Take 1 tablet by mouth daily at 12 noon.     Allergies:   Ibuprofen   Social History   Socioeconomic History   Marital status: Married    Spouse name: Not on file   Number of children: Not on file   Years of education: Not on file   Highest education level: Not on file  Occupational History   Not on file  Tobacco Use   Smoking status: Never   Smokeless tobacco: Never  Vaping  Use   Vaping status: Never Used  Substance and Sexual Activity   Alcohol use: No    Alcohol/week: 0.0 standard drinks of alcohol   Drug use: No   Sexual activity: Yes    Partners: Male  Other Topics Concern   Not on file  Social History Narrative   Not on file   Social Determinants of Health   Financial Resource Strain: Not on file  Food Insecurity: Not on file  Transportation Needs: Not on  file  Physical Activity: Not on file  Stress: Not on file  Social Connections: Not on file     Family History  Problem Relation Age of Onset   Kidney disease Mother    Hypertension Mother    Polycystic kidney disease Mother    Hypertension Father    Hypertension Sister    Breast cancer Sister 76       invasive ductal cancer, negative genetic testing   Diabetes Paternal Uncle    Heart attack Maternal Grandfather    Brain cancer Paternal Grandmother 78   Diabetes Paternal Grandfather    Heart attack Paternal Grandfather    Stomach cancer Paternal Grandfather 11   Breast cancer Other 68       MGM's sister   Prostate cancer Other        MGM's brother    ROS:   Please see the history of present illness.    All other systems reviewed and are negative.   Labs/EKG Reviewed:    EKG:   EKG is ordered today.  The ekg ordered today demonstrates NSR, HR 80bpm  Recent Labs: No results found for requested labs within last 365 days.   Recent Lipid Panel No results found for: "CHOL", "TRIG", "HDL", "CHOLHDL", "LDLCALC", "LDLDIRECT"  Physical Exam:    VS:  BP 134/88   Pulse 89   Ht 5' (1.524 m)   Wt 181 lb 8 oz (82.3 kg)   LMP 08/18/2022   SpO2 99%   BMI 35.45 kg/m     Wt Readings from Last 3 Encounters:  01/02/23 181 lb 8 oz (82.3 kg)  11/19/22 175 lb 1.6 oz (79.4 kg)  12/17/21 160 lb (72.6 kg)     GEN:  Well nourished, well developed in no acute distress HEENT: Normal NECK: No JVD; No carotid bruits CARDIAC: RRR, 2/6 systolic murmur RESPIRATORY:  Clear to  auscultation without rales, wheezing or rhonchi  ABDOMEN: Gravid, nontender MUSCULOSKELETAL:  No edema; No deformity  SKIN: Warm and dry NEUROLOGIC:  Alert and oriented x 3 PSYCHIATRIC:  Normal affect    Risk Assessment/Risk Calculators:     ASSESSMENT & PLAN:    #HTN: -Developed in the setting of polycystic kidney disease -Previously on medications but was weaned off when she lost weight -Currently pregnant and BP mainly 120-low 130s off medications; has nifedipine if needed if BP sustains >mid-130s/90s  -TTE with normal EF 60-65%, no significant valve disease, normal filling pressures -Discussed she is higher risk for pre-eclampsia and will need close monitoring of BP throughout pregnancy -Continue ASA 81mg  daily  #Polycystic Kidney Disease: -Follows with [facility]  Patient Instructions  Medication Instructions:   Your physician recommends that you continue on your current medications as directed. Please refer to the Current Medication list given to you today.  *If you need a refill on your cardiac medications before your next appointment, please call your pharmacy*    Follow-Up:  12 WEEKS WITH DR. KARDIE TOBB EITHER HERE AT [facility] OR OUR [facility] OFFICE    Dispo:  No follow-ups on file.   Medication Adjustments/Labs and Tests Ordered: Current medicines are reviewed at length with the patient today.  Concerns regarding medicines are outlined above.  Tests Ordered: Orders Placed This Encounter  Procedures   EKG 12-Lead   Medication Changes: No orders of the defined types were placed in this encounter.

## 2023-01-01 ENCOUNTER — Ambulatory Visit: Payer: BC Managed Care – PPO | Admitting: *Deleted

## 2023-01-01 ENCOUNTER — Ambulatory Visit: Payer: BC Managed Care – PPO | Attending: Maternal & Fetal Medicine

## 2023-01-01 VITALS — BP 146/80 | HR 87

## 2023-01-01 DIAGNOSIS — O99212 Obesity complicating pregnancy, second trimester: Secondary | ICD-10-CM | POA: Insufficient documentation

## 2023-01-01 DIAGNOSIS — O35BXX0 Maternal care for other (suspected) fetal abnormality and damage, fetal cardiac anomalies, not applicable or unspecified: Secondary | ICD-10-CM | POA: Diagnosis not present

## 2023-01-01 DIAGNOSIS — O99891 Other specified diseases and conditions complicating pregnancy: Secondary | ICD-10-CM | POA: Diagnosis not present

## 2023-01-01 DIAGNOSIS — Q612 Polycystic kidney, adult type: Secondary | ICD-10-CM | POA: Diagnosis not present

## 2023-01-01 DIAGNOSIS — E669 Obesity, unspecified: Secondary | ICD-10-CM

## 2023-01-01 DIAGNOSIS — O10012 Pre-existing essential hypertension complicating pregnancy, second trimester: Secondary | ICD-10-CM | POA: Diagnosis not present

## 2023-01-01 DIAGNOSIS — Z3A18 18 weeks gestation of pregnancy: Secondary | ICD-10-CM

## 2023-01-02 ENCOUNTER — Encounter: Payer: Self-pay | Admitting: Cardiology

## 2023-01-02 ENCOUNTER — Ambulatory Visit: Payer: BC Managed Care – PPO | Admitting: Cardiology

## 2023-01-02 ENCOUNTER — Ambulatory Visit (HOSPITAL_COMMUNITY)
Admission: RE | Admit: 2023-01-02 | Discharge: 2023-01-02 | Disposition: A | Discharge status: Home / Self Care | Payer: BC Managed Care – PPO | Source: Ambulatory Visit | Attending: Obstetrics and Gynecology | Admitting: Obstetrics and Gynecology

## 2023-01-02 VITALS — BP 134/88 | HR 89 | Ht 60.0 in | Wt 181.5 lb

## 2023-01-02 DIAGNOSIS — Q613 Polycystic kidney, unspecified: Secondary | ICD-10-CM

## 2023-01-02 DIAGNOSIS — I351 Nonrheumatic aortic (valve) insufficiency: Secondary | ICD-10-CM | POA: Diagnosis not present

## 2023-01-02 DIAGNOSIS — O10919 Unspecified pre-existing hypertension complicating pregnancy, unspecified trimester: Secondary | ICD-10-CM

## 2023-01-02 DIAGNOSIS — I10 Essential (primary) hypertension: Secondary | ICD-10-CM | POA: Insufficient documentation

## 2023-01-02 DIAGNOSIS — Z3A19 19 weeks gestation of pregnancy: Secondary | ICD-10-CM

## 2023-01-02 LAB — ECHOCARDIOGRAM COMPLETE
AR max vel: 1.85 cm2
AV Area VTI: 1.53 cm2
AV Area mean vel: 1.64 cm2
AV Mean grad: 5 mmHg
AV Peak grad: 8.1 mmHg
Ao pk vel: 1.42 m/s
Calc EF: 71.8 %
S' Lateral: 2.43 cm
Single Plane A2C EF: 72.3 %
Single Plane A4C EF: 70.2 %

## 2023-01-02 NOTE — Patient Instructions (Signed)
Medication Instructions:   Your physician recommends that you continue on your current medications as directed. Please refer to the Current Medication list given to you today.  *If you need a refill on your cardiac medications before your next appointment, please call your pharmacy*    Follow-Up:  12 WEEKS WITH DR. KARDIE TOBB EITHER HERE AT [facility] CL[facility] OR OUR [facility] OFFICE

## 2023-01-04 ENCOUNTER — Other Ambulatory Visit: Payer: Self-pay

## 2023-01-16 ENCOUNTER — Other Ambulatory Visit: Payer: Self-pay | Admitting: Obstetrics & Gynecology

## 2023-01-16 DIAGNOSIS — Z3A22 22 weeks gestation of pregnancy: Secondary | ICD-10-CM

## 2023-01-16 DIAGNOSIS — O280 Abnormal hematological finding on antenatal screening of mother: Secondary | ICD-10-CM

## 2023-01-16 DIAGNOSIS — Z362 Encounter for other antenatal screening follow-up: Secondary | ICD-10-CM

## 2023-01-28 ENCOUNTER — Ambulatory Visit: Payer: BC Managed Care – PPO | Attending: Obstetrics | Admitting: Obstetrics

## 2023-01-28 ENCOUNTER — Ambulatory Visit: Payer: BC Managed Care – PPO | Attending: Obstetrics & Gynecology

## 2023-01-28 ENCOUNTER — Ambulatory Visit: Payer: BC Managed Care – PPO | Admitting: *Deleted

## 2023-01-28 ENCOUNTER — Other Ambulatory Visit: Payer: Self-pay | Admitting: *Deleted

## 2023-01-28 VITALS — BP 138/93 | HR 92

## 2023-01-28 DIAGNOSIS — O99212 Obesity complicating pregnancy, second trimester: Secondary | ICD-10-CM

## 2023-01-28 DIAGNOSIS — Z362 Encounter for other antenatal screening follow-up: Secondary | ICD-10-CM | POA: Insufficient documentation

## 2023-01-28 DIAGNOSIS — O280 Abnormal hematological finding on antenatal screening of mother: Secondary | ICD-10-CM | POA: Insufficient documentation

## 2023-01-28 DIAGNOSIS — Z3A22 22 weeks gestation of pregnancy: Secondary | ICD-10-CM | POA: Diagnosis present

## 2023-01-28 DIAGNOSIS — O35EXX0 Maternal care for other (suspected) fetal abnormality and damage, fetal genitourinary anomalies, not applicable or unspecified: Secondary | ICD-10-CM | POA: Diagnosis not present

## 2023-01-28 DIAGNOSIS — O10912 Unspecified pre-existing hypertension complicating pregnancy, second trimester: Secondary | ICD-10-CM | POA: Diagnosis present

## 2023-01-28 NOTE — Progress Notes (Signed)
MFM Note  Rebecca [name] was seen for a follow-up exam at 22 weeks and 5 days due to chronic hypertension and history of autosomal dominant polycystic kidney disease.    She was prescribed nifedipine for blood pressure control but has not started taking the medication yet as her blood pressures at home have been in the 110s to 120s over 70s to 80s range.  Her pregnancy has also been complicated by an elevated MSAFP of 3.04 MoM.  She was informed that the fetal growth and amniotic fluid level appears appropriate for her gestational age.    The following were discussed during today's consultation:  Elevated MSAFP of 3.04 MoM  The patient was reassured that today's ultrasound did not reveal an anatomical cause for the increased MSAFP.  There were no sonographic signs of spina bifida or an anterior abdominal wall defect noted today.    She was advised regarding the limitations of ultrasound in the detection of all anomalies and that it will diagnose approximately 90% of neural tube defects.    The association of an elevated MSAFP with placental dysfunction which may manifest later in her pregnancy as fetal growth restriction along with with other adverse pregnancy outcomes such as fetal demise was discussed.  Due to the elevated MSAFP and advanced maternal age, the patient was offered and declined an amniocentesis today for definitive diagnosis of fetal aneuploidy and spina bifida.  Chronic hypertension with autosomal dominant polycystic kidney disease  The patient showed me her blood pressure log on her phone which all indicated blood pressures that were in the normal range.  As her blood pressures are within normal limits at this time, she was advised that she may hold off on taking nifedipine for now.    She understands that her blood pressures may increase as she enters the third trimester and she may have to start nifedipine treatment at that time.    The increased risk of superimposed  preeclampsia was discussed.  She should continue monitoring her blood pressures at home and continue taking a daily baby aspirin for preeclampsia prophylaxis.  She was advised that as her polycystic kidney disease is inherited in an autosomal dominant fashion, that there is a high likelihood that her children will also be affected by this condition.  She was advised to have her baby checked after birth to determine if her child also has polycystic kidney disease.  Due to her elevated MSAFP and chronic hypertension, weekly fetal testing should be started at around 32 weeks.  The weekly testing may be performed in your office.    A follow-up growth scan was scheduled in our office in 4 weeks.    The patient stated that all of her questions were answered today.  A total of 30 minutes was spent counseling and coordinating the care for this patient.  Greater than 50% of the time was spent in direct face-to-face contact.

## 2023-02-04 ENCOUNTER — Telehealth: Payer: Self-pay | Admitting: Cardiology

## 2023-02-04 NOTE — Telephone Encounter (Signed)
Patient reports her BP readings have been higher than usual and she discussed with her OB yesterday. She was advised to start taking nifedipine 30mg  daily.  Per Dr. Shari Prows: nifedipine PRN for BP consistently > mid-130's/90's.  BP this morning was 130's/upper 80's. Took nifedipine at 8:30am, and 2 hours later she "felt bad" and checked her BP with reading of 130/96. She states she then laid down, and felt better with BP now reading 120/81. She reports at that time she had a headache "all over" and closed her eyes. Headache has now improved. She is staying hydrated as best as she can. Taking Tylenol for headache.  Denies blurred vision/visual changes or SOB. She states she does have swelling in feet and ankles, improves with elevation. Reports her hands were swollen this morning, but not now at time of call.  Informed patient headaches are a common side effect of nifedipine that usually improves after the first week of taking. Advised patient to continue to stay hydrated, elevate feet when sitting, and monitor BP.   Will forward to Dr. Servando Salina to review and advise. Patient asked if she could try a lower dose of nifedipine? She also mentioned she has taken labetalol in the past but did develop SOB with exertion and when sitting at desk.  Patient has appt with Dr. Servando Salina on 03/27/23.  Informed patient if headache does not improve or worsens before she hears back from [place], to callback. Patient verbalized understanding.

## 2023-02-04 NOTE — Telephone Encounter (Signed)
Pt c/o medication issue:  1. Name of Medication:   NIFEdipine (PROCARDIA XL) 30 MG 24 hr tablet    2. How are you currently taking this medication (dosage and times per day)?  Take 1 tablet (30 mg total) by mouth daily.Patient not taking: Reported on 01/28/2023       3. Are you having a reaction (difficulty breathing--STAT)? No  4. What is your medication issue? Pt stated taking this medication today and hours later she started feeling very bad. She stated she now even has a very bad headache and is requesting a callback to be advised on what to do next. Please advise

## 2023-02-05 ENCOUNTER — Encounter: Payer: Self-pay | Admitting: Pharmacist

## 2023-02-05 NOTE — Telephone Encounter (Signed)
Called and spoke with patient. Reports went to OB yesterday and systolic BP increased to >140. Believes anxiety may have been a factor. OB instructed her to take her nifedipine. Took medication and had a severe headache. Blood pressure this morning was 121/86. Patient will continue to monitor today and over weekend and submit readings over mychart if elevated. Scheduled for Wednesday 9/4

## 2023-02-11 ENCOUNTER — Ambulatory Visit: Payer: BC Managed Care – PPO

## 2023-02-13 ENCOUNTER — Encounter (HOSPITAL_COMMUNITY): Payer: Self-pay | Admitting: Obstetrics and Gynecology

## 2023-02-13 ENCOUNTER — Inpatient Hospital Stay (HOSPITAL_COMMUNITY)
Admission: AD | Admit: 2023-02-13 | Discharge: 2023-02-19 | DRG: 832 | Disposition: A | Discharge status: Home / Self Care | Payer: BC Managed Care – PPO | Attending: Obstetrics and Gynecology | Admitting: Obstetrics and Gynecology

## 2023-02-13 ENCOUNTER — Other Ambulatory Visit: Payer: Self-pay

## 2023-02-13 DIAGNOSIS — O1490 Unspecified pre-eclampsia, unspecified trimester: Secondary | ICD-10-CM | POA: Diagnosis present

## 2023-02-13 DIAGNOSIS — Q613 Polycystic kidney, unspecified: Secondary | ICD-10-CM

## 2023-02-13 DIAGNOSIS — Z3A25 25 weeks gestation of pregnancy: Secondary | ICD-10-CM | POA: Diagnosis not present

## 2023-02-13 DIAGNOSIS — O1402 Mild to moderate pre-eclampsia, second trimester: Secondary | ICD-10-CM | POA: Diagnosis not present

## 2023-02-13 DIAGNOSIS — O26832 Pregnancy related renal disease, second trimester: Secondary | ICD-10-CM | POA: Diagnosis present

## 2023-02-13 DIAGNOSIS — O10912 Unspecified pre-existing hypertension complicating pregnancy, second trimester: Secondary | ICD-10-CM | POA: Diagnosis present

## 2023-02-13 DIAGNOSIS — O112 Pre-existing hypertension with pre-eclampsia, second trimester: Secondary | ICD-10-CM | POA: Diagnosis not present

## 2023-02-13 DIAGNOSIS — O119 Pre-existing hypertension with pre-eclampsia, unspecified trimester: Secondary | ICD-10-CM

## 2023-02-13 LAB — URINALYSIS, ROUTINE W REFLEX MICROSCOPIC
Bacteria, UA: NONE SEEN
Bilirubin Urine: NEGATIVE
Glucose, UA: NEGATIVE mg/dL
Ketones, ur: NEGATIVE mg/dL
Leukocytes,Ua: NEGATIVE
Nitrite: NEGATIVE
Protein, ur: 30 mg/dL — AB
Specific Gravity, Urine: 1.004 — ABNORMAL LOW (ref 1.005–1.030)
pH: 7 (ref 5.0–8.0)

## 2023-02-13 LAB — CBC
HCT: 35.4 % — ABNORMAL LOW (ref 36.0–46.0)
Hemoglobin: 12.1 g/dL (ref 12.0–15.0)
MCH: 32.4 pg (ref 26.0–34.0)
MCHC: 34.2 g/dL (ref 30.0–36.0)
MCV: 94.9 fL (ref 80.0–100.0)
Platelets: 178 10*3/uL (ref 150–400)
RBC: 3.73 MIL/uL — ABNORMAL LOW (ref 3.87–5.11)
RDW: 12.2 % (ref 11.5–15.5)
WBC: 15.6 10*3/uL — ABNORMAL HIGH (ref 4.0–10.5)
nRBC: 0 % (ref 0.0–0.2)

## 2023-02-13 LAB — TYPE AND SCREEN
ABO/RH(D): O NEG
Antibody Screen: POSITIVE

## 2023-02-13 LAB — COMPREHENSIVE METABOLIC PANEL
ALT: 19 U/L (ref 0–44)
AST: 20 U/L (ref 15–41)
Albumin: 2.8 g/dL — ABNORMAL LOW (ref 3.5–5.0)
Alkaline Phosphatase: 80 U/L (ref 38–126)
Anion gap: 11 (ref 5–15)
BUN: 13 mg/dL (ref 6–20)
CO2: 21 mmol/L — ABNORMAL LOW (ref 22–32)
Calcium: 9.2 mg/dL (ref 8.9–10.3)
Chloride: 105 mmol/L (ref 98–111)
Creatinine, Ser: 0.66 mg/dL (ref 0.44–1.00)
GFR, Estimated: >60 mL/min (ref >60)
Glucose, Bld: 82 mg/dL (ref 70–99)
Potassium: 4.6 mmol/L (ref 3.5–5.1)
Sodium: 137 mmol/L (ref 135–145)
Total Bilirubin: 0.3 mg/dL (ref 0.3–1.2)
Total Protein: 5.7 g/dL — ABNORMAL LOW (ref 6.5–8.1)

## 2023-02-13 LAB — PROTEIN / CREATININE RATIO, URINE
Creatinine, Urine: 26 mg/dL
Protein Creatinine Ratio: 1.19 mg/mg{creat} — ABNORMAL HIGH (ref 0.00–0.15)
Total Protein, Urine: 31 mg/dL

## 2023-02-13 MED ORDER — DOCUSATE SODIUM 100 MG PO CAPS
100.0000 mg | ORAL_CAPSULE | Freq: Every day | ORAL | Status: DC
  Administered 2023-02-13 – 2023-02-16 (×3): 100 mg via ORAL
  Filled 2023-02-13 (×4): qty 1

## 2023-02-13 MED ORDER — PRENATAL MULTIVITAMIN CH: 1.0000 | ORAL_TABLET | Freq: Every day | ORAL | Status: DC

## 2023-02-13 MED ORDER — PRENATAL MULTIVITAMIN CH
1.0000 | ORAL_TABLET | Freq: Every day | ORAL | Status: DC
  Administered 2023-02-13 – 2023-02-18 (×6): 1 via ORAL
  Filled 2023-02-13 (×6): qty 1

## 2023-02-13 MED ORDER — LABETALOL HCL 5 MG/ML IV SOLN: 40.0000 mg | INTRAVENOUS | Status: DC | PRN

## 2023-02-13 MED ORDER — LABETALOL HCL 100 MG PO TABS
100.0000 mg | ORAL_TABLET | Freq: Three times a day (TID) | ORAL | Status: DC
  Administered 2023-02-13 (×2): 100 mg via ORAL
  Filled 2023-02-13 (×2): qty 1

## 2023-02-13 MED ORDER — CALCIUM CARBONATE ANTACID 500 MG PO CHEW
2.0000 | CHEWABLE_TABLET | ORAL | Status: DC | PRN
  Administered 2023-02-17: 400 mg via ORAL
  Filled 2023-02-13: qty 2

## 2023-02-13 MED ORDER — ACETAMINOPHEN 325 MG PO TABS
650.0000 mg | ORAL_TABLET | ORAL | Status: DC | PRN
  Administered 2023-02-13 – 2023-02-15 (×4): 650 mg via ORAL
  Filled 2023-02-13 (×5): qty 2

## 2023-02-13 MED ORDER — ASPIRIN 81 MG PO CHEW
81.0000 mg | CHEWABLE_TABLET | Freq: Every day | ORAL | Status: DC
  Administered 2023-02-13 – 2023-02-18 (×6): 81 mg via ORAL
  Filled 2023-02-13 (×6): qty 1

## 2023-02-13 MED ORDER — HYDRALAZINE HCL 20 MG/ML IJ SOLN: 10.0000 mg | INTRAMUSCULAR | Status: DC | PRN

## 2023-02-13 MED ORDER — ACETAMINOPHEN-CAFFEINE 500-65 MG PO TABS
2.0000 | ORAL_TABLET | Freq: Once | ORAL | Status: AC
  Administered 2023-02-13: 2 via ORAL
  Filled 2023-02-13: qty 2

## 2023-02-13 MED ORDER — LABETALOL HCL 5 MG/ML IV SOLN
20.0000 mg | INTRAVENOUS | Status: DC | PRN
  Administered 2023-02-13: 20 mg via INTRAVENOUS
  Filled 2023-02-13: qty 4

## 2023-02-13 MED ORDER — HYDRALAZINE HCL 20 MG/ML IJ SOLN: 5.0000 mg | INTRAMUSCULAR | Status: DC | PRN

## 2023-02-13 MED ORDER — ASPIRIN 81 MG PO CHEW: 81.0000 mg | CHEWABLE_TABLET | Freq: Every day | ORAL | Status: DC

## 2023-02-13 MED ORDER — LACTATED RINGERS IV SOLN: INTRAVENOUS | Status: DC

## 2023-02-13 NOTE — MAU Provider Note (Cosign Needed Addendum)
PreE R/o     S Ms. [name] is a 32 y.o. G20P0010 female at [redacted]w[redacted]d who presents to MAU today being sent from clinic for BP 162/98.  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  Previously tried Nifedipine however had to discontinue 2/2 side effects (brain fog and HA).  Recently started on Labetalol 100mg  BID a few days prior to presentation today in clinic. Had previously tried labetalol previously prior to pregnancy but had to discontinue given induced SOB, denies SOB with BID dosing since 9/3.  Patient also endorsing HA that she describes as pressure.   Receives care at [facility]. Prenatal records reviewed.  Pertinent items noted in HPI and remainder of comprehensive ROS otherwise negative.   O LMP 08/18/2022  Physical Exam Constitutional:      General: She is not in acute distress.    Appearance: Normal appearance. She is not ill-appearing.  HENT:     Head: Normocephalic and atraumatic.     Right Ear: External ear normal.     Left Ear: External ear normal.     Nose: Nose normal.     Mouth/Throat:     Mouth: Mucous membranes are moist.     Pharynx: Oropharynx is clear.  Eyes:     General:        Right eye: No discharge.        Left eye: No discharge.     Extraocular Movements: Extraocular movements intact.     Conjunctiva/sclera: Conjunctivae normal.  Cardiovascular:     Rate and Rhythm: Normal rate and regular rhythm.     Heart sounds: No murmur heard.    No friction rub. No gallop.  Pulmonary:     Effort: Pulmonary effort is normal. No respiratory distress.     Breath sounds: Normal breath sounds. No stridor. No wheezing.  Abdominal:     Tenderness: There is no abdominal tenderness.  Musculoskeletal:        General: No swelling. Normal range of motion.     Cervical back: Normal range of motion.  Skin:    General: Skin is warm and dry.  Neurological:     Mental Status: She  is alert and oriented to person, place, and time.     Motor: No weakness.     Deep Tendon Reflexes: Reflexes abnormal.     Comments: Brisk DTRs, no clonus   Psychiatric:        Mood and Affect: Mood normal.        Behavior: Behavior normal.        Thought Content: Thought content normal.        Judgment: Judgment normal.      MDM: High MAU Course:  NST:150bpm, mild variability, 10x10 accels, no decels, no ctx cat 2   Severe range in triage, new elevations in UPC from 797 on 9/3 to 1190 today, down trending plts from 205 > 178, Cr 0.66, LFTs wnl stable.  Will give additional Labetalol 100mg  now. Pt states has head pressure, will give Excedrin tension to see if helps.  Denies CP, SOB, RUQ pain, endorses swelling but clinically not.  UA straw, hazy colored with small Hgb positive, 30 protein (s/o PCKD) though otherwise noninfectious. Called Dr. Henderson Cloud to discuss and agrees with observation admission.    A&P: #[redacted]weeks gestation #CKD s/o PCKD #PreE w/o severe features Further evaluation and management by Dr. Henderson Cloud.   Mittie Bodo  C, MD 02/13/2023 11:43 AM

## 2023-02-13 NOTE — MAU Note (Signed)
Rebecca [name] is a 32 y.o. at [redacted]w[redacted]d here in MAU reporting: she was sent from [facility] for BP evaluation.  Denies current HA, visual disturbances and epigastric pain.  States has pressure I head, but would not consider it a HA.  Denies VB or LOF.  Endorses +FM. LMP: NA Onset of complaint: today Pain score: 0 Vitals:   02/13/23 1149  BP: (!) 162/101  Pulse: 70  Resp: 18  Temp: 98.2 F (36.8 C)  SpO2: 100%     FHT:153 bpm Lab orders placed from triage:   UA

## 2023-02-13 NOTE — H&P (Signed)
[name] is a 32 y.o. G2P0010 female at [redacted]w[redacted]d who presents to MAU today being sent from clinic for BP 162/98.  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  Previously tried Nifedipine however had to discontinue 2/2 side effects (brain fog and HA).  Recently started on Labetalol 100mg  BID a few days prior to presentation today in clinic. Had previously tried labetalol previously prior to pregnancy but had to discontinue given induced SOB, denies SOB with BID dosing since 9/3.  Patient also endorsing HA that she describes as pressure.    Receives care at [facility]. Prenatal records reviewed.   Pertinent items noted in HPI and remainder of comprehensive ROS otherwise negative.    O LMP 08/18/2022  Physical Exam Constitutional:      General: She is not in acute distress.    Appearance: Normal appearance. She is not ill-appearing.  HENT:     Head: Normocephalic and atraumatic.     Right Ear: External ear normal.     Left Ear: External ear normal.     Nose: Nose normal.     Mouth/Throat:     Mouth: Mucous membranes are moist.     Pharynx: Oropharynx is clear.  Eyes:     General:        Right eye: No discharge.        Left eye: No discharge.     Extraocular Movements: Extraocular movements intact.     Conjunctiva/sclera: Conjunctivae normal.  Cardiovascular:     Rate and Rhythm: Normal rate and regular rhythm.     Heart sounds: No murmur heard.    No friction rub. No gallop.  Pulmonary:     Effort: Pulmonary effort is normal. No respiratory distress.     Breath sounds: Normal breath sounds. No stridor. No wheezing.  Abdominal:     Tenderness: There is no abdominal tenderness.  Musculoskeletal:        General: No swelling. Normal range of motion.     Cervical back: Normal range of motion.  Skin:    General: Skin is warm and dry.  Neurological:     Mental Status: She is alert and  oriented to person, place, and time.     Motor: No weakness.     Deep Tendon Reflexes: Reflexes abnormal.     Comments: Brisk DTRs, no clonus   Psychiatric:        Mood and Affect: Mood normal.        Behavior: Behavior normal.        Thought Content: Thought content normal.        Judgment: Judgment normal.        MDM: High MAU Course:   NST:150bpm, mild variability, 10x10 accels, no decels, no ctx cat 2     Severe range in triage, new elevations in UPC from 797 on 9/3 to 1190 today, down trending plts from 205 > 178, Cr 0.66, LFTs wnl stable.  Will give additional Labetalol 100mg  now. Pt states has head pressure, will give Excedrin tension to see if helps.  Denies CP, SOB, RUQ pain, endorses swelling but clinically not.  UA straw, hazy colored with small Hgb positive, 30 protein (s/o PCKD) though otherwise noninfectious. Called Dr. Henderson Cloud to discuss and agrees with observation admission.      A&P: #[redacted]weeks gestation #CKD s/o PCKD #PreE w/o severe features Further evaluation and management by Dr. Henderson Cloud.  Hessie Dibble, MD 02/13/2023 11:43 AM    Patient examined HA is better now, feeling better She had mild SOB when taking labetalol 100mg  every day before pregnancy. No SOB now after extra dose of labetalol 100mg  recently administered.   A/P: preeclampsia without severe features           Continue labetalol 100mg  TID, ASA 81mg  every day            Labs in am           D/W Dr Judeth Cornfield, MFM. He will see patient in am          PCKD followed by nephrology          FWB-NST cat one                  NST q shift

## 2023-02-14 DIAGNOSIS — O1402 Mild to moderate pre-eclampsia, second trimester: Secondary | ICD-10-CM | POA: Diagnosis present

## 2023-02-14 DIAGNOSIS — O119 Pre-existing hypertension with pre-eclampsia, unspecified trimester: Secondary | ICD-10-CM

## 2023-02-14 DIAGNOSIS — O10012 Pre-existing essential hypertension complicating pregnancy, second trimester: Secondary | ICD-10-CM

## 2023-02-14 DIAGNOSIS — O99892 Other specified diseases and conditions complicating childbirth: Secondary | ICD-10-CM | POA: Diagnosis not present

## 2023-02-14 DIAGNOSIS — Q613 Polycystic kidney, unspecified: Secondary | ICD-10-CM | POA: Diagnosis not present

## 2023-02-14 DIAGNOSIS — Z3A24 24 weeks gestation of pregnancy: Secondary | ICD-10-CM | POA: Diagnosis not present

## 2023-02-14 DIAGNOSIS — O112 Pre-existing hypertension with pre-eclampsia, second trimester: Secondary | ICD-10-CM | POA: Diagnosis present

## 2023-02-14 DIAGNOSIS — Z3A25 25 weeks gestation of pregnancy: Secondary | ICD-10-CM | POA: Diagnosis not present

## 2023-02-14 DIAGNOSIS — O26832 Pregnancy related renal disease, second trimester: Secondary | ICD-10-CM | POA: Diagnosis present

## 2023-02-14 DIAGNOSIS — O10912 Unspecified pre-existing hypertension complicating pregnancy, second trimester: Secondary | ICD-10-CM | POA: Diagnosis present

## 2023-02-14 LAB — COMPREHENSIVE METABOLIC PANEL
ALT: 20 U/L (ref 0–44)
AST: 19 U/L (ref 15–41)
Albumin: 2.5 g/dL — ABNORMAL LOW (ref 3.5–5.0)
Alkaline Phosphatase: 71 U/L (ref 38–126)
Anion gap: 10 (ref 5–15)
BUN: 12 mg/dL (ref 6–20)
CO2: 20 mmol/L — ABNORMAL LOW (ref 22–32)
Calcium: 8.6 mg/dL — ABNORMAL LOW (ref 8.9–10.3)
Chloride: 104 mmol/L (ref 98–111)
Creatinine, Ser: 0.66 mg/dL (ref 0.44–1.00)
GFR, Estimated: >60 mL/min (ref >60)
Glucose, Bld: 82 mg/dL (ref 70–99)
Potassium: 4.1 mmol/L (ref 3.5–5.1)
Sodium: 134 mmol/L — ABNORMAL LOW (ref 135–145)
Total Bilirubin: 0.2 mg/dL — ABNORMAL LOW (ref 0.3–1.2)
Total Protein: 5 g/dL — ABNORMAL LOW (ref 6.5–8.1)

## 2023-02-14 LAB — CBC
HCT: 31.7 % — ABNORMAL LOW (ref 36.0–46.0)
Hemoglobin: 10.7 g/dL — ABNORMAL LOW (ref 12.0–15.0)
MCH: 31.8 pg (ref 26.0–34.0)
MCHC: 33.8 g/dL (ref 30.0–36.0)
MCV: 94.1 fL (ref 80.0–100.0)
Platelets: 168 10*3/uL (ref 150–400)
RBC: 3.37 MIL/uL — ABNORMAL LOW (ref 3.87–5.11)
RDW: 12.3 % (ref 11.5–15.5)
WBC: 13.1 10*3/uL — ABNORMAL HIGH (ref 4.0–10.5)
nRBC: 0 % (ref 0.0–0.2)

## 2023-02-14 MED ORDER — LABETALOL HCL 100 MG PO TABS
100.0000 mg | ORAL_TABLET | Freq: Three times a day (TID) | ORAL | Status: DC
  Administered 2023-02-14 (×2): 100 mg via ORAL
  Filled 2023-02-14 (×2): qty 1

## 2023-02-14 MED ORDER — LABETALOL HCL 200 MG PO TABS
200.0000 mg | ORAL_TABLET | Freq: Three times a day (TID) | ORAL | Status: DC
  Administered 2023-02-14 – 2023-02-16 (×7): 200 mg via ORAL
  Filled 2023-02-14 (×8): qty 1

## 2023-02-14 NOTE — Consult Note (Signed)
Maternal-Fetal Medicine  Name: Rebecca [name] DOB: 1991-03-13 MRN: Referring Provider:   I had the pleasure of seeing Ms. Segrist [facility] at [facility]. She is G2 P0010 at 25w 1d gestation admitted with severe hypertension.  Her mother and her husband were present at consultation. Past medical history is significant for autosomal dominant polycystic kidney disease (ADPKD).  Patient reports her blood pressures have been normal to mild range hypertension before pregnancy and in early pregnancy.  Patient was taking nifedipine.  She had side effects from nifedipine (headache and "brain fog").  On 02/10/2023, her home blood pressures were increased with systolic blood pressures at 140-150 mmHg.  She was seen at your office and labetalol 100 mg twice daily was started and nifedipine was discontinued.    Yesterday, at your office her blood pressure was 162/98 mmHg and the patient was sent over to the MAU.  At MAU, her blood pressures ranged to 1 49-1 61/86-97 mmHg and she received IV labetalol 20 mg.  Currently, she is taking labetalol 100 mg 3 times daily.  And her blood pressures are in the mild hypertensive range.  Patient is being followed by her nephrologist.  She was seen by our cardiologist in July 2024 and maternal echocardiography showed normal cardiac structures and good ejection fraction of 60% to 65%.  Prenatal course: On cell-free fetal DNA screening, the risks of fetal aneuploidies are not increased.  MSAFP was increased at 3.04 MoM.  Fetal anatomical survey ruled out intracranial and spinal anomalies.  No obvious fetal structural defects were seen.  Most recent fetal growth assessment (01/28/2023) showed a normally grown fetus. She does not have severe headache or visual disturbances or right upper quadrant pain or vaginal bleeding.  She reports good fetal movements.  Past medical history: ADPKD was diagnosed in her 23s.  No history of diabetes or any other chronic medical  conditions. Past surgical history: Nil of note. Allergies: Ibuprofen (swollen lips). Social history: Denies tobacco or drug or alcohol use.  She has been married 2 years and her husband is in good health. GYN history: History of abnormal Pap smears but no cervical surgeries. Obstetrical history significant for an early spontaneous miscarriage at [redacted] weeks gestation (no D&C was performed. Family history: Mother, sister and aunt have ADPKD.  No history of brain aneurysms in the family.  P/E: Patient is comfortably sitting up; not in distress. Vitals: Blood pressure range 1 46-1 53/84-92 mmHg (12 hours).  Pulse 75/minute, RR 20/minute, afebrile. HEENT: Normal; no lymphadenopathy. Abdomen: Soft gravid uterus; no tenderness.  No flank tenderness. Minimal pedal edema. NST reassuring.  Labs: Hemoglobin 10.7, hematocrit 31.7, WBC 13.1, platelets 168.  Sodium 134, other electrolytes normal, AST 19, ALT 20, creatinine 0.66.  Protein/creatinine ratio 1.19.  I counseled the patient on the following: Chronic hypertension with possible superimposed preeclampsia I explained the diagnosis of preeclampsia and that it is difficult to differentiate from chronic hypertension.  Possible complications of preeclampsia including eclampsia, stroke, coagulation disturbances, pulmonary edema, acute renal failure and placental abruption.  Our goal is to control blood pressure to prevent maternal and fetal complications.  Antihypertensive dosages may have to be increased and acute hypertension management with IV antihypertensives may be required. I counseled the patient that inpatient management may continue for indefinite period of time and the decision to discharge will be based on maternal condition.  Delivery decisions will be based on severity of maternal complications and fetal status.  I discussed the benefit of  antenatal corticosteroids that may be given when delivery is anticipated shortly. If delivery is  decided, it is more likely to be a cesarean delivery given her early gestational age and the possibility of unripe cervix.  In the study of 92 patients with ADPKD compared with 152 patients with simple renal cysts, neonatal outcomes were similar.  However, maternal complications including worsening of chronic hypertension, worsening of renal function, increased proteinuria and UTIs were all increased in women with ADPKD.   Recommendations -Consider increasing labetalol to 200 mg 3 times daily. -Labs twice weekly initially and then once weekly. -Screen for gestational diabetes now because of possibility of betamethasone administration. -Limited ultrasound on Monday (02/16/2023). -Fetal growth assessment next week (3 weeks from the previous scan). -NICU consultation. -Consent for emergency cesarean section to be taken.  Thank you for consultation.  If you have any questions or concerns, please contact me the [facility].  Consultation including face-to-face counseling 45 minutes.

## 2023-02-14 NOTE — Progress Notes (Signed)
Had mild HA relieved by Tylenol, no vision changes  Today's Vitals   02/14/23 1120 02/14/23 1533 02/14/23 1542 02/14/23 1631  BP:  (!) 148/89    Pulse: 75 74    Resp: 20 20    Temp: 98.5 F (36.9 C) 98.4 F (36.9 C)    TempSrc: Oral Oral    SpO2: 99% 98%    Weight:      Height:      PainSc:   3  0-No pain   Body mass index is 37.48 kg/m.   Abdomen soft, NT  NST-no decels  Results for orders placed or performed during the hospital encounter of 02/13/23 (from the past 24 hour(s))  Comprehensive metabolic panel     Status: Abnormal   Collection Time: 02/14/23  5:27 AM  Result Value Ref Range   Sodium 134 (L) 135 - 145 mmol/L   Potassium 4.1 3.5 - 5.1 mmol/L   Chloride 104 98 - 111 mmol/L   CO2 20 (L) 22 - 32 mmol/L   Glucose, Bld 82 70 - 99 mg/dL   BUN 12 6 - 20 mg/dL   Creatinine, Ser 9.60 0.44 - 1.00 mg/dL   Calcium 8.6 (L) 8.9 - 10.3 mg/dL   Total Protein 5.0 (L) 6.5 - 8.1 g/dL   Albumin 2.5 (L) 3.5 - 5.0 g/dL   AST 19 15 - 41 U/L   ALT 20 0 - 44 U/L   Alkaline Phosphatase 71 38 - 126 U/L   Total Bilirubin 0.2 (L) 0.3 - 1.2 mg/dL   GFR, Estimated >45 >40 mL/min   Anion gap 10 5 - 15  CBC     Status: Abnormal   Collection Time: 02/14/23  5:27 AM  Result Value Ref Range   WBC 13.1 (H) 4.0 - 10.5 K/uL   RBC 3.37 (L) 3.87 - 5.11 MIL/uL   Hemoglobin 10.7 (L) 12.0 - 15.0 g/dL   HCT 98.1 (L) 19.1 - 47.8 %   MCV 94.1 80.0 - 100.0 fL   MCH 31.8 26.0 - 34.0 pg   MCHC 33.8 30.0 - 36.0 g/dL   RDW 29.5 62.1 - 30.8 %   Platelets 168 150 - 400 K/uL   nRBC 0.0 0.0 - 0.2 %     A/P: Appreciate MFM consultation          - labetalol increased to 200 mg 3 times daily. -Labs twice weekly initially and then once weekly-repeat Tuesday. -Screen for gestational diabetes now because of possibility of betamethasone administration- ordered for tomorrow. -Limited ultrasound on Monday (02/16/2023). -Fetal growth assessment next week (3 weeks from the previous scan). -NICU  consultation ordered. -D/W possible cesarean section delivery and risks including infection, organ damage, bleeding/transfusion-HIV/Hep, DVT/PE, pneumonia, classical uterine incision with C/S for future deliveries -D/W possible magnesium sulfate for neuro protection and/or seizure prophylaxis  D/W patient and her mother above, all questions answered.

## 2023-02-14 NOTE — Progress Notes (Signed)
No HA, no vision change  Today's Vitals   02/13/23 2001 02/13/23 2015 02/14/23 0018 02/14/23 0453  BP:  (!) 150/84 139/81 (!) 149/83  Pulse:  69 70 70  Resp: 16 16 16 17   Temp: 98.6 F (37 C)  98 F (36.7 C) 98.9 F (37.2 C)  TempSrc: Oral  Oral Oral  SpO2: 99% 99%  100%  Weight:      Height:      PainSc:  0-No pain     Body mass index is 37.48 kg/m.   Uterus soft NT Abdomen NT DTR 1+  NST some accels, no decels  Results for orders placed or performed during the hospital encounter of 02/13/23 (from the past 24 hour(s))  Protein / creatinine ratio, urine     Status: Abnormal   Collection Time: 02/13/23 11:38 AM  Result Value Ref Range   Creatinine, Urine 26 mg/dL   Total Protein, Urine 31 mg/dL   Protein Creatinine Ratio 1.19 (H) 0.00 - 0.15 mg/mg[Cre]  Urinalysis, Routine w reflex microscopic -Urine, Clean Catch     Status: Abnormal   Collection Time: 02/13/23 11:44 AM  Result Value Ref Range   Color, Urine STRAW (A) YELLOW   APPearance HAZY (A) CLEAR   Specific Gravity, Urine 1.004 (L) 1.005 - 1.030   pH 7.0 5.0 - 8.0   Glucose, UA NEGATIVE NEGATIVE mg/dL   Hgb urine dipstick SMALL (A) NEGATIVE   Bilirubin Urine NEGATIVE NEGATIVE   Ketones, ur NEGATIVE NEGATIVE mg/dL   Protein, ur 30 (A) NEGATIVE mg/dL   Nitrite NEGATIVE NEGATIVE   Leukocytes,Ua NEGATIVE NEGATIVE   RBC / HPF 0-5 0 - 5 RBC/hpf   WBC, UA 0-5 0 - 5 WBC/hpf   Bacteria, UA NONE SEEN NONE SEEN   Squamous Epithelial / HPF 6-10 0 - 5 /HPF  Comprehensive metabolic panel     Status: Abnormal   Collection Time: 02/13/23 12:08 PM  Result Value Ref Range   Sodium 137 135 - 145 mmol/L   Potassium 4.6 3.5 - 5.1 mmol/L   Chloride 105 98 - 111 mmol/L   CO2 21 (L) 22 - 32 mmol/L   Glucose, Bld 82 70 - 99 mg/dL   BUN 13 6 - 20 mg/dL   Creatinine, Ser 9.60 0.44 - 1.00 mg/dL   Calcium 9.2 8.9 - 45.4 mg/dL   Total Protein 5.7 (L) 6.5 - 8.1 g/dL   Albumin 2.8 (L) 3.5 - 5.0 g/dL   AST 20 15 - 41 U/L   ALT 19  0 - 44 U/L   Alkaline Phosphatase 80 38 - 126 U/L   Total Bilirubin 0.3 0.3 - 1.2 mg/dL   GFR, Estimated >09 >81 mL/min   Anion gap 11 5 - 15  CBC     Status: Abnormal   Collection Time: 02/13/23 12:08 PM  Result Value Ref Range   WBC 15.6 (H) 4.0 - 10.5 K/uL   RBC 3.73 (L) 3.87 - 5.11 MIL/uL   Hemoglobin 12.1 12.0 - 15.0 g/dL   HCT 19.1 (L) 47.8 - 29.5 %   MCV 94.9 80.0 - 100.0 fL   MCH 32.4 26.0 - 34.0 pg   MCHC 34.2 30.0 - 36.0 g/dL   RDW 62.1 30.8 - 65.7 %   Platelets 178 150 - 400 K/uL   nRBC 0.0 0.0 - 0.2 %  Type and screen [facility]     Status: None   Collection Time: 02/13/23  2:11 PM  Result Value  Ref Range   ABO/RH(D) O NEG    Antibody Screen POS    Sample Expiration 02/16/2023,2359    Antibody Identification      PASSIVELY ACQUIRED ANTI-D Performed at [facility], [address] [place], [place] [address]   CBC     Status: Abnormal   Collection Time: 02/14/23  5:27 AM  Result Value Ref Range   WBC 13.1 (H) 4.0 - 10.5 K/uL   RBC 3.37 (L) 3.87 - 5.11 MIL/uL   Hemoglobin 10.7 (L) 12.0 - 15.0 g/dL   HCT 32.4 (L) 40.1 - 02.7 %   MCV 94.1 80.0 - 100.0 fL   MCH 31.8 26.0 - 34.0 pg   MCHC 33.8 30.0 - 36.0 g/dL   RDW 25.3 66.4 - 40.3 %   Platelets 168 150 - 400 K/uL   nRBC 0.0 0.0 - 0.2 %    CMET pending  A/P: preeclampsia without severe features          Labetalol 100mg  TID          Labs pending          MFM consult today          PCKD-nephrology

## 2023-02-15 LAB — GLUCOSE, CAPILLARY: Glucose-Capillary: 81 mg/dL (ref 70–99)

## 2023-02-15 LAB — GLUCOSE, RANDOM: Glucose, Bld: 153 mg/dL — ABNORMAL HIGH (ref 70–99)

## 2023-02-15 NOTE — Progress Notes (Signed)
25  2/7 wks  Mild HA, no vision change, just received Tylenol  Today's Vitals   02/15/23 0715 02/15/23 0908 02/15/23 0929 02/15/23 0935  BP:   128/75   Pulse:   75   Resp:   18   Temp:   98.6 F (37 C)   TempSrc:   Oral   SpO2:   99%   Weight:      Height:      PainSc: 0-No pain 0-No pain  3    Body mass index is 37.48 kg/m.   Abdomen NT  FHT no decels, some accels  1 Hour GTT done this am >153  A/P: CHTN with possible superimposed preeclampsia         PCKD         - labetalol increased to 200 mg 3 times daily. -Labs twice weekly initially and then once weekly-repeat Tuesday. - one hour GTT elevated> three hour GTT tomorrow -Limited ultrasound on Monday (02/16/2023). -Fetal growth assessment next week (3 weeks from the previous scan). -NICU consultation ordered. -D/W possible cesarean section delivery and risks including infection, organ damage, bleeding/transfusion-HIV/Hep, DVT/PE, pneumonia, classical uterine incision with C/S for future deliveries -D/W possible magnesium sulfate for neuro protection and/or seizure prophylaxis

## 2023-02-15 NOTE — Consult Note (Signed)
[facility]  Prenatal Consult      02/15/2023   5:35 PM  I was asked by Dr. Henderson Cloud to consult on this patient for possible preterm delivery. I had the pleasure of meeting with Rebecca [name] and her husband today.   She is a  yo G32P0010 female presenting at [redacted]w[redacted]d IUP with concerns for cHTN with superimposed pre-eclampsia and possible need for preterm delivery. Pregnancy has also been complicated by PCKD, elevated 1h GTT. She has not received BMZ. Most recent estimated fetal weight of 473 (16%) grams on 8/21 ([redacted]w[redacted]d); repeat scan scheduled for tomorrow. They are having a girl.  We discussed the possible needs for an infant born at this gestation. I explained that the neonatal intensive care team would be present for the delivery and outlined the likely delivery room course for this baby including routine resuscitation and NRP-guided approaches to the treatment of respiratory distress. We discussed the potential need for CPAP or mechanical ventilation and surfactant administration for respiratory distress, IV fluids pending establishment of enteral feeds, temperature support, and monitoring.   We discussed other common problems associated with prematurity including respiratory distress syndrome/CLD, feeding issues, temperature regulation, and infection risk. I also discussed the potential risk of complications such as intracranial hemorrhage and a range of long term developmental delays, and how these risks decrease with each additional week of gestation.  We discussed the importance of good nutrition and various methods of providing nutrition (parenteral hyperalimentation, gavage feedings and/or oral feeding). We discussed the benefits of human milk, and she desires to breastfeed. I encouraged breast feeding and pumping soon after birth and outlined resources that are available to support breast feeding. We discussed the possibility of using donor breast milk as a bridge, and they  expressed the desire to use DBM if needed to supplement MBM.  We discussed the average length of stay but I noted that the actual LOS would depend on the severity of problems encountered and response to treatments. We discussed visitation policies and the resources available while their child is in the hospital.  They expressed understanding and agreement with the plan for resuscitation and intensive care if delivery is required. All questions were answered.  Thank you for involving [place] in the care of this patient. A member of our team will be available should the family have additional questions.   Simone Curia, MD Neonatal Medicine  I spent ~40 minutes in consultation time, of which 25 minutes was spent in direct face to face counseling.

## 2023-02-16 LAB — GLUCOSE, RANDOM: Glucose, Bld: 76 mg/dL (ref 70–99)

## 2023-02-16 LAB — GLUCOSE, 2 HOUR GESTATIONAL: Glucose Tolerance, 2 hour: 160 mg/dL

## 2023-02-16 LAB — GLUCOSE, 1 HOUR GESTATIONAL: Glucose Tolerance, 1 hour: 181 mg/dL

## 2023-02-16 LAB — GLUCOSE, 3 HOUR GESTATIONAL: Glucose Tolerance, 3 Hour: 107 mg/dL

## 2023-02-16 MED ORDER — ASPIRIN 81 MG PO TBEC: 81.0000 mg | DELAYED_RELEASE_TABLET | Freq: Every day | ORAL | Status: DC

## 2023-02-16 MED ORDER — LABETALOL HCL 100 MG PO TABS
100.0000 mg | ORAL_TABLET | Freq: Three times a day (TID) | ORAL | Status: DC
  Administered 2023-02-16 (×3): 100 mg via ORAL
  Filled 2023-02-16 (×3): qty 1

## 2023-02-16 MED ORDER — ZOLPIDEM TARTRATE 5 MG PO TABS
5.0000 mg | ORAL_TABLET | Freq: Every evening | ORAL | Status: DC | PRN
  Filled 2023-02-16: qty 1

## 2023-02-16 MED ORDER — ZOLPIDEM TARTRATE 5 MG PO TABS
5.0000 mg | ORAL_TABLET | Freq: Once | ORAL | Status: DC
  Filled 2023-02-16: qty 1

## 2023-02-16 NOTE — Progress Notes (Signed)
25  2/7 wks Doing well. Currently without any HA, vision changes, SOB. She has felt very anxious since admission but feeling a little better this AM. She is hoping to be discharged as soon as it is safe.   Today's Vitals   02/16/23 0342 02/16/23 0620 02/16/23 0807 02/16/23 0809  BP: 119/68 124/72  131/73  Pulse: 69 83  75  Resp: 16   16  Temp: 98 F (36.7 C)   98 F (36.7 C)  TempSrc: Oral     SpO2: 98% 98%    Weight:      Height:      PainSc:   0-No pain    Body mass index is 37.48 kg/m.   Abdomen NT FHT reassuring overall for 25 weeks.   3 hr GTT requested (failed 1 hr)  A/P: 32 yo G2P0 at 25.3 wga HD#3 with Hx PCKD and CHTN with possible superimposed preeclampsia -discussed in detail pre-eclampsia. Discussed risks with expectant management but that fetal benefits typically outweigh risk of prematurity until 34gwa with severe features and 37wga without severe features. Discussed indications for delivery prior to that.  - Anticipate Magnesium initiation when delivery is indicated for seizure proph and also for CP proph given <32 wga at this time - defer BMZ for [facility] for now given anticipated delivery is several weeks away - labetalol  200 mg 3 times daily. D/w pt nowhere near maxing out 2 agents. Would be open to trying Nifedipine again if needed (gave her HA) - Labs twice weekly initially and then once weekly-repeat Tuesday. - one hour GTT elevated> three hour GTT today - Limited ultrasound TODAY with BPP - Fetal growth assessment next week (3 weeks from the previous scan). - s/p NICU consultation  - would be for CS if delivery needed close to this GA   Rosie Fate  MD

## 2023-02-17 ENCOUNTER — Inpatient Hospital Stay (HOSPITAL_COMMUNITY): Payer: BC Managed Care – PPO

## 2023-02-17 DIAGNOSIS — O10012 Pre-existing essential hypertension complicating pregnancy, second trimester: Secondary | ICD-10-CM

## 2023-02-17 DIAGNOSIS — Z3A24 24 weeks gestation of pregnancy: Secondary | ICD-10-CM

## 2023-02-17 LAB — COMPREHENSIVE METABOLIC PANEL
ALT: 20 U/L (ref 0–44)
AST: 21 U/L (ref 15–41)
Albumin: 2.7 g/dL — ABNORMAL LOW (ref 3.5–5.0)
Alkaline Phosphatase: 79 U/L (ref 38–126)
Anion gap: 8 (ref 5–15)
BUN: 16 mg/dL (ref 6–20)
CO2: 19 mmol/L — ABNORMAL LOW (ref 22–32)
Calcium: 8.9 mg/dL (ref 8.9–10.3)
Chloride: 109 mmol/L (ref 98–111)
Creatinine, Ser: 0.75 mg/dL (ref 0.44–1.00)
GFR, Estimated: >60 mL/min (ref >60)
Glucose, Bld: 97 mg/dL (ref 70–99)
Potassium: 4.3 mmol/L (ref 3.5–5.1)
Sodium: 136 mmol/L (ref 135–145)
Total Bilirubin: 0.2 mg/dL — ABNORMAL LOW (ref 0.3–1.2)
Total Protein: 5.6 g/dL — ABNORMAL LOW (ref 6.5–8.1)

## 2023-02-17 LAB — CBC
HCT: 35.3 % — ABNORMAL LOW (ref 36.0–46.0)
Hemoglobin: 12 g/dL (ref 12.0–15.0)
MCH: 32.1 pg (ref 26.0–34.0)
MCHC: 34 g/dL (ref 30.0–36.0)
MCV: 94.4 fL (ref 80.0–100.0)
Platelets: 156 10*3/uL (ref 150–400)
RBC: 3.74 MIL/uL — ABNORMAL LOW (ref 3.87–5.11)
RDW: 12.2 % (ref 11.5–15.5)
WBC: 12.9 10*3/uL — ABNORMAL HIGH (ref 4.0–10.5)
nRBC: 0 % (ref 0.0–0.2)

## 2023-02-17 MED ORDER — LABETALOL HCL 200 MG PO TABS
200.0000 mg | ORAL_TABLET | Freq: Three times a day (TID) | ORAL | Status: DC
  Administered 2023-02-17 – 2023-02-19 (×7): 200 mg via ORAL
  Filled 2023-02-17 (×7): qty 1

## 2023-02-17 NOTE — Progress Notes (Signed)
Antepartum Progress Note   Rebecca [name] is a 32 y.o. female G2P0010 that is admitted to [facility] for chronic hypertension management.  Overnight, she reports no acute events.  Admits fetal movement, denies contractions, denies leakage of fluid, denies vaginal bleeding.  She has not had headache and her "brain fog" has been resolved since discontinuing procardia.   History   Blood pressure (!) 117/57, pulse 73, temperature 98 F (36.7 C), temperature source Oral, resp. rate 16, height 5' (1.524 m), weight 87 kg, last menstrual period 08/18/2022, SpO2 100%, unknown if currently breastfeeding. Exam  Physical Exam: Gen: Alert, well appearing, no distress Chest: nonlabored breathing CV: no peripheral edema Abdomen: gravid, soft and nontender Ext: no evidence of DVT  FHT: reactive and reassuring for gestational age, BPP planned today  Assessment/Plan: Admitted to [facility] for management of hypertension Chronic hypertension with possible superimposed preeclampsia BP much better controlled, current regimen has been labetalol 300 mg TID...  This AM dose held for BP of 117/57. Will proceed with labetalol 200 mg TID if mild range BP return PIH symptoms have included brain fog Repeat CBC and CMP this AM [place] scheduled today, will be due for growth [place] next week S/p NICU and MFM consultation EFM and toco 3 hr glucola passed yesterday BMZ has not been indicated, will provide if delivery is anticipated, as well as NP mag for GA < 32 weeks Diet: reg DVT Ppx: SCDs Dispo:  inpatient monitoring pending BP control.  May consider discharge today.   Lyn Henri 02/17/2023, 7:50 AM

## 2023-02-17 NOTE — Progress Notes (Signed)
At bedside to check on patient.  She has continued to feel well.  Repeat labs this AM were reassuring.  OB US revealed vertex presentation and normal fluid.  BP 131/77 (BP Location: Left Arm)   Pulse 72   Temp 98.2 F (36.8 C) (Oral)   Resp 19   Ht 5' (1.524 m)   Wt 87 kg   LMP 08/18/2022   SpO2 99%   BMI 37.48 kg/m   Blood pressure has been well controlled in normal range on the 200 mg TID of labetalol.  Discussed discharge planning with MFM and we will collect a 24 hr urine protein while inpatient.  Urine protein content will be confounded by CKD, however may be useful in the event of progression of PIH.   FHT this afternoon is reassuring for gestational age.   All questions answered.  Nilda Simmer MD

## 2023-02-18 LAB — PROTEIN, URINE, 24 HOUR
Collection Interval-UPROT: 24 h
Protein, 24H Urine: 2546 mg/d — ABNORMAL HIGH (ref 50–100)
Protein, Urine: 134 mg/dL
Urine Total Volume-UPROT: 1900 mL

## 2023-02-18 MED ORDER — ONDANSETRON 4 MG PO TBDP: 8.0000 mg | ORAL_TABLET | Freq: Three times a day (TID) | ORAL | Status: DC | PRN

## 2023-02-18 NOTE — Progress Notes (Signed)
IUP at 25 w 5 days  S:  Patient is feeling better this morning. Started 24 hour urine collection around 8 pm last night.  O:   BP 125/77 (BP Location: Left Arm)   Pulse 76   Temp 99.1 F (37.3 C) (Oral)   Resp 16   Ht 5' (1.524 m)   Wt 87 kg   LMP 08/18/2022   SpO2 100%   BMI 37.48 kg/m  Results for orders placed or performed during the hospital encounter of 02/13/23 (from the past 24 hour(s))  CBC     Status: Abnormal   Collection Time: 02/17/23  9:03 AM  Result Value Ref Range   WBC 12.9 (H) 4.0 - 10.5 K/uL   RBC 3.74 (L) 3.87 - 5.11 MIL/uL   Hemoglobin 12.0 12.0 - 15.0 g/dL   HCT 16.1 (L) 09.6 - 04.5 %   MCV 94.4 80.0 - 100.0 fL   MCH 32.1 26.0 - 34.0 pg   MCHC 34.0 30.0 - 36.0 g/dL   RDW 40.9 81.1 - 91.4 %   Platelets 156 150 - 400 K/uL   nRBC 0.0 0.0 - 0.2 %  Comprehensive metabolic panel     Status: Abnormal   Collection Time: 02/17/23  9:03 AM  Result Value Ref Range   Sodium 136 135 - 145 mmol/L   Potassium 4.3 3.5 - 5.1 mmol/L   Chloride 109 98 - 111 mmol/L   CO2 19 (L) 22 - 32 mmol/L   Glucose, Bld 97 70 - 99 mg/dL   BUN 16 6 - 20 mg/dL   Creatinine, Ser 7.82 0.44 - 1.00 mg/dL   Calcium 8.9 8.9 - 95.6 mg/dL   Total Protein 5.6 (L) 6.5 - 8.1 g/dL   Albumin 2.7 (L) 3.5 - 5.0 g/dL   AST 21 15 - 41 U/L   ALT 20 0 - 44 U/L   Alkaline Phosphatase 79 38 - 126 U/L   Total Bilirubin 0.2 (L) 0.3 - 1.2 mg/dL   GFR, Estimated >21 >30 mL/min   Anion gap 8 5 - 15   Scheduled Meds:  aspirin  81 mg Oral QHS   docusate sodium  100 mg Oral Daily   labetalol  200 mg Oral TID   prenatal multivitamin  1 tablet Oral QHS   Continuous Infusions: PRN Meds:.acetaminophen, calcium carbonate, hydrALAZINE **AND** hydrALAZINE **AND** labetalol **AND** labetalol **AND** Measure blood pressure, zolpidem IMPRESSION: IUP at 25 w 5 days  Chronic kidney disease/ Chronic Hypertension Follow by Dr Signe Colt at [facility] Doing well on Labetalol 200 mg po tid  Plan is to receive 24  hour urine results - may not be until tomorrow am and then discharge home Please send 24 hour urine results to nephrologist dr Signe Colt  Plan reviewed with patient

## 2023-02-18 NOTE — H&P (Signed)
Maternal-Fetal Medicine  G2 P0 at 25w 5d gestation. Patient was admitted with severe-range hypertension 5 days ago and hypertension was controlled with IV antihypertensives and labetalol,  She has autosomal dominant polycystic kidney disease (ADPKD).  Patient does not have symptoms of severe headache or visual disturbances or right upper quadrant/epigastric pain or vaginal bleeding. She reports good fetal movements.  Labs (09/10) including liver enzymes and platelets are within normal range.  P/E: Patient is comfortably sitting up; not in distress. HEENT: Normal; no lymphadenopathy. Abdomen: Soft gravid uterus; no tenderness. No flank pain. Minimal pedal edema present. NST reassuring.  Chronic hypertension -Hypertension seems better controlled with labetalol. She does not have symptoms of severe features of preeclampsia. I counseled the patient that superimposed preeclampsia is difficult to diagnose, and she had baseline proteinuria in early pregnancy.  She has 24-hour urine collection for proteins in progress.   It is possible she had worsening of chronic hypertension that required increase in labetalol dosage. I informed her that severe hypertension can recur even after discharge and readmission may be required. Our goal is to keep her blood pressures normal or mild hypertensive range.  Patient is planning to get a BP instrument with a large cuff.  She reports that she is not allergic to nifedipine but can lead to "brain fog" and mild headaches.  Recommendations -Discharge may be considered tomorrow if she does not have severe-range hypertension. -Severity of proteinuria should not affect our management and inpatient management for increased proteinuria alone is not necessary. -Consider increasing antihypertensive dosage if SBP is 155 mm Hg or greater and/or DBP is 105 mm Hg or greater. -Follow-up weekly at your office for a 3 to 4 weeks to monitor her blood pressure. -Patient has  an appointment for MFM ultrasound on 02/25/23.

## 2023-02-19 MED ORDER — LABETALOL HCL 200 MG PO TABS: 200.0000 mg | ORAL_TABLET | Freq: Three times a day (TID) | ORAL | 2 refills | Status: DC

## 2023-02-19 MED ORDER — ACETAMINOPHEN 325 MG PO TABS
650.0000 mg | ORAL_TABLET | Freq: Once | ORAL | Status: AC
  Administered 2023-02-19: 650 mg via ORAL
  Filled 2023-02-19: qty 2

## 2023-02-19 NOTE — Plan of Care (Signed)
  Problem: Education: Goal: Knowledge of disease or condition will improve Outcome: Adequate for Discharge Goal: Knowledge of the prescribed therapeutic regimen will improve Outcome: Adequate for Discharge   Problem: Fluid Volume: Goal: Peripheral tissue perfusion will improve Outcome: Adequate for Discharge   Problem: Clinical Measurements: Goal: Complications related to disease process, condition or treatment will be avoided or minimized Outcome: Adequate for Discharge   Problem: Education: Goal: Knowledge of disease or condition will improve Outcome: Adequate for Discharge Goal: Knowledge of the prescribed therapeutic regimen will improve Outcome: Adequate for Discharge Goal: Individualized Educational Video(s) Outcome: Adequate for Discharge   Problem: Clinical Measurements: Goal: Complications related to the disease process, condition or treatment will be avoided or minimized Outcome: Adequate for Discharge   Problem: Education: Goal: Knowledge of General Education information will improve Description: Including pain rating scale, medication(s)/side effects and non-pharmacologic comfort measures Outcome: Adequate for Discharge   Problem: Health Behavior/Discharge Planning: Goal: Ability to manage health-related needs will improve Outcome: Adequate for Discharge   Problem: Clinical Measurements: Goal: Ability to maintain clinical measurements within normal limits will improve Outcome: Adequate for Discharge Goal: Will remain free from infection Outcome: Adequate for Discharge Goal: Diagnostic test results will improve Outcome: Adequate for Discharge Goal: Respiratory complications will improve Outcome: Adequate for Discharge Goal: Cardiovascular complication will be avoided Outcome: Adequate for Discharge   Problem: Activity: Goal: Risk for activity intolerance will decrease Outcome: Adequate for Discharge   Problem: Nutrition: Goal: Adequate nutrition will be  maintained Outcome: Adequate for Discharge   Problem: Coping: Goal: Level of anxiety will decrease Outcome: Adequate for Discharge   Problem: Elimination: Goal: Will not experience complications related to bowel motility Outcome: Adequate for Discharge Goal: Will not experience complications related to urinary retention Outcome: Adequate for Discharge   Problem: Pain Managment: Goal: General experience of comfort will improve Outcome: Adequate for Discharge   Problem: Safety: Goal: Ability to remain free from injury will improve Outcome: Adequate for Discharge   Problem: Skin Integrity: Goal: Risk for impaired skin integrity will decrease Outcome: Adequate for Discharge

## 2023-02-19 NOTE — Progress Notes (Signed)
Pt alert and oriented x4. Pain stable, Vital signs stable.   02/19/23 1033  Departure Condition  Departure Condition Stable  Mobility at [facility]  Patient/Caregiver Teaching Discharge instructions reviewed;Prescriptions reviewed;Follow-up care reviewed;Medications discussed;Educated about hypertension in pregnancy;Teach Back Method Used;Patient/caregiver verbalized understanding  Departure Mode With family  Was procedural sedation performed on this patient during this visit? No   Lenna Sciara, RN, BSN 02/19/2023 10:37 AM

## 2023-02-19 NOTE — Discharge Summary (Signed)
Physician Discharge Summary  Patient ID: Rebecca [name] MRN: [id number] DOB/AGE: Mar 25, 1991 32 y.o.  Admit date: 02/13/2023 Discharge date: 02/19/2023  Admission Diagnoses:  Discharge Diagnoses:  Principal Problem:   Chronic hypertension with superimposed preeclampsia Active Problems:   Preeclampsia   Polycystic kidney disease   [redacted] weeks gestation of pregnancy  Discharged Condition: good  Hospital Course: Patient was admitted for BP monitoring after severe range BP in the office. She has history of polycystic kidney disease for which she has been followed by nephrology - has had normal to mild range Bps prior to pregnancy and was on Procardia prior to this admission. She was admitted and switched to labetalol 200mg  TID - her Bps remained normal to mild range on this medication. BMZ administration was deferred. A 24 hour urine collection was done which resulted with proteinuria. However, as her Bps remained within normal limits to mild range and she remained asymptomatic, she was discharged home with close follow up in the office in <1 week for BP check and labs.   She has a home BP cuff but has just ordered a larger cuff size as her readings on current cuff have not been accurate when compared with hospital BP readings.  She was instructed to call the office with any preE symptoms or elevated BP readings. She has an MFM f/u US next week.  Consults: MFM  Significant Diagnostic Studies: ultrasound, labs  Treatments: IV and oral BP meds  Discharge Exam: Blood pressure 139/78, pulse 72, temperature 98.3 F (36.8 C), temperature source Oral, resp. rate 18, height 5' (1.524 m), weight 87 kg, last menstrual period 08/18/2022, SpO2 100%, unknown if currently breastfeeding. General appearance: alert and cooperative Resp: no increased work of breathing Cardio: regular heart rate GI: soft, nontender, gravid Extremities: no edema, redness or tenderness in the calves or thighs  Fetal heart  monitoring reassuring for gestational age  Disposition: Discharge disposition: 01-Home or Self Care      Discharge Instructions     Discharge activity:  No Restrictions   Complete by: As directed    Discharge diet:  No restrictions   Complete by: As directed    Discharge instructions   Complete by: As directed    Call with any symptoms of preeclampsia or elevated BP readings on  home cuff   No sexual activity restrictions   Complete by: As directed    Notify physician for a general feeling that "something is not right"   Complete by: As directed    Notify physician for increase or change in vaginal discharge   Complete by: As directed    Notify physician for intestinal cramps, with or without diarrhea, sometimes described as "gas pain"   Complete by: As directed    Notify physician for leaking of fluid   Complete by: As directed    Notify physician for low, dull backache, unrelieved by heat or Tylenol   Complete by: As directed    Notify physician for menstrual like cramps   Complete by: As directed    Notify physician for pelvic pressure   Complete by: As directed    Notify physician for uterine contractions.  These may be painless and feel like the uterus is tightening or the baby is  "balling up"   Complete by: As directed    Notify physician for vaginal bleeding   Complete by: As directed    PRETERM LABOR:  Includes any of the follwing symptoms that occur between 20 - [redacted] weeks gestation.  If these symptoms are not stopped, preterm labor can result in preterm delivery, placing your baby at risk   Complete by: As directed       Allergies as of 02/19/2023       Reactions   Ibuprofen Anaphylaxis   Angioedema        Medication List     STOP taking these medications    NIFEdipine 30 MG 24 hr tablet Commonly known as: Procardia XL   ondansetron 4 MG disintegrating tablet Commonly known as: ZOFRAN-ODT       TAKE these medications    albuterol 108 (90  Base) MCG/ACT inhaler Commonly known as: VENTOLIN HFA Inhale 1 puff into the lungs 4 (four) times daily as needed.   aspirin EC 81 MG tablet Take 81 mg by mouth daily. Swallow whole.   labetalol 200 MG tablet Commonly known as: NORMODYNE Take 1 tablet (200 mg total) by mouth 3 (three) times daily. What changed:  medication strength how much to take when to take this   metoCLOPramide 10 MG tablet Commonly known as: REGLAN Take 10 mg by mouth at bedtime.   multivitamin-prenatal 27-0.8 MG Tabs tablet Take 1 tablet by mouth daily at 12 noon.         Signed: Tawni Levy 02/19/2023, 8:06 AM

## 2023-02-25 ENCOUNTER — Other Ambulatory Visit: Payer: Self-pay | Admitting: Obstetrics

## 2023-02-25 ENCOUNTER — Ambulatory Visit: Payer: BC Managed Care – PPO | Admitting: *Deleted

## 2023-02-25 ENCOUNTER — Ambulatory Visit: Payer: BC Managed Care – PPO | Attending: Obstetrics

## 2023-02-25 ENCOUNTER — Ambulatory Visit (HOSPITAL_BASED_OUTPATIENT_CLINIC_OR_DEPARTMENT_OTHER): Payer: BC Managed Care – PPO | Admitting: Obstetrics

## 2023-02-25 ENCOUNTER — Other Ambulatory Visit: Payer: Self-pay | Admitting: *Deleted

## 2023-02-25 VITALS — BP 136/89 | HR 83

## 2023-02-25 DIAGNOSIS — O365920 Maternal care for other known or suspected poor fetal growth, second trimester, not applicable or unspecified: Secondary | ICD-10-CM

## 2023-02-25 DIAGNOSIS — Q613 Polycystic kidney, unspecified: Secondary | ICD-10-CM

## 2023-02-25 DIAGNOSIS — O10912 Unspecified pre-existing hypertension complicating pregnancy, second trimester: Secondary | ICD-10-CM

## 2023-02-25 DIAGNOSIS — O99891 Other specified diseases and conditions complicating pregnancy: Secondary | ICD-10-CM

## 2023-02-25 DIAGNOSIS — Z3A26 26 weeks gestation of pregnancy: Secondary | ICD-10-CM | POA: Diagnosis present

## 2023-02-25 DIAGNOSIS — E669 Obesity, unspecified: Secondary | ICD-10-CM | POA: Diagnosis not present

## 2023-02-25 DIAGNOSIS — O281 Abnormal biochemical finding on antenatal screening of mother: Secondary | ICD-10-CM

## 2023-02-25 DIAGNOSIS — N189 Chronic kidney disease, unspecified: Secondary | ICD-10-CM

## 2023-02-25 DIAGNOSIS — O26832 Pregnancy related renal disease, second trimester: Secondary | ICD-10-CM | POA: Diagnosis present

## 2023-02-25 DIAGNOSIS — O10012 Pre-existing essential hypertension complicating pregnancy, second trimester: Secondary | ICD-10-CM | POA: Diagnosis not present

## 2023-02-25 DIAGNOSIS — O99212 Obesity complicating pregnancy, second trimester: Secondary | ICD-10-CM

## 2023-02-25 NOTE — Progress Notes (Signed)
MFM Note  Rebecca [name] was seen due to chronic hypertension, polycystic kidney disease, and an unexplained elevated MSAFP of 3.04 MoM.  She is currently 26 weeks and 5 days pregnant.   She was hospitalized last week due to severe range blood pressures and possible superimposed preeclampsia.  Her 24-hour urine results showed 2546 mg of protein.  She did not receive a course of antenatal corticosteroids during her admission as delivery was not being considered at that time.    Since being discharged home, she remains asymptomatic with no signs or symptoms of preeclampsia.  Her blood pressures at home have been in the 130s over 90s range.  Her blood pressure in our office today was 136/89.  Her labetalol dose was increased earlier this week to 300 mg 3 times a day.  She had PIH labs drawn earlier this week which were all within normal limits.    On today's exam, the EFW of 1 pound 12 ounces (799 grams) measures at the 5th percentile for her gestational age indicating IUGR.    There was normal amniotic fluid noted on today's exam.    Fetal movements were also noted throughout today's exam.    Doppler studies of the umbilical arteries showed an elevated S/D ratio of 5.67 .  There were no signs of absent or reversed end-diastolic flow.    The following were discussed during today's consultation:  IUGR with chronic hypertension and possible superimposed preeclampsia  The patient was advised that due to her history of chronic kidney disease, it is uncertain if the significant proteinuria noted on her 24-hour urine is due to superimposed preeclampsia or her kidney disease.   She should continue to monitor for signs and symptoms of preeclampsia and to continue with the plan to have weekly PIH labs drawn.    Inpatient management with the administration of a course of antenatal corticosteroids to help her reach a more optimal gestational age for delivery should be considered should she develop any signs  or symptoms of preeclampsia, any worsening of her blood pressures, or should her PIH labs show any abnormalities.  The patient was advised that IUGR is a common finding in women with chronic kidney disease.  There is the possibility that IUGR noted on today's exam may also be related to placental dysfunction as evidenced by her elevated MSAFP level.  Due to IUGR with elevated umbilical artery Doppler studies, we will continue to follow her with weekly ultrasounds and weekly fetal testing.  The patient reports that she will be seen twice a week, once in your office and another time in our office later in the week.  She should continue to have weekly PIH labs drawn.  Inpatient management may also be recommended should her umbilical artery Doppler studies show absent or reversed end-diastolic flow.  The patient understands that the goal for her delivery would be at 34 weeks or greater.    However, she understands that delivery prior to 34 weeks may be indicated should her blood pressures worsen, should her her PIH labs show abnormalities, or should there be reversed end-diastolic flow noted on her umbilical artery Doppler studies.    We will continue to follow her closely with you.    She will return to our office in 1 week for another umbilical artery Doppler study and NST.    The patient and her husband stated that all of their questions were answered today.  A total of 30 minutes was spent counseling and coordinating the  care for this patient.  Greater than 50% of the time was spent in direct face-to-face contact.

## 2023-03-02 DIAGNOSIS — R772 Abnormality of alphafetoprotein: Secondary | ICD-10-CM | POA: Insufficient documentation

## 2023-03-03 ENCOUNTER — Other Ambulatory Visit: Payer: Self-pay

## 2023-03-03 ENCOUNTER — Inpatient Hospital Stay (HOSPITAL_COMMUNITY)
Admission: AD | Admit: 2023-03-03 | Discharge: 2023-03-12 | DRG: 787 | Disposition: A | Discharge status: Home / Self Care | Payer: BC Managed Care – PPO | Attending: Obstetrics and Gynecology | Admitting: Obstetrics and Gynecology

## 2023-03-03 ENCOUNTER — Encounter (HOSPITAL_COMMUNITY): Payer: Self-pay | Admitting: Obstetrics and Gynecology

## 2023-03-03 DIAGNOSIS — Z833 Family history of diabetes mellitus: Secondary | ICD-10-CM | POA: Diagnosis not present

## 2023-03-03 DIAGNOSIS — O112 Pre-existing hypertension with pre-eclampsia, second trimester: Secondary | ICD-10-CM

## 2023-03-03 DIAGNOSIS — Z3A27 27 weeks gestation of pregnancy: Secondary | ICD-10-CM

## 2023-03-03 DIAGNOSIS — Z8271 Family history of polycystic kidney: Secondary | ICD-10-CM | POA: Diagnosis not present

## 2023-03-03 DIAGNOSIS — O321XX0 Maternal care for breech presentation, not applicable or unspecified: Secondary | ICD-10-CM | POA: Diagnosis present

## 2023-03-03 DIAGNOSIS — O281 Abnormal biochemical finding on antenatal screening of mother: Secondary | ICD-10-CM | POA: Diagnosis not present

## 2023-03-03 DIAGNOSIS — Z886 Allergy status to analgesic agent status: Secondary | ICD-10-CM

## 2023-03-03 DIAGNOSIS — D696 Thrombocytopenia, unspecified: Secondary | ICD-10-CM | POA: Diagnosis present

## 2023-03-03 DIAGNOSIS — Z7982 Long term (current) use of aspirin: Secondary | ICD-10-CM | POA: Diagnosis not present

## 2023-03-03 DIAGNOSIS — O1092 Unspecified pre-existing hypertension complicating childbirth: Secondary | ICD-10-CM | POA: Diagnosis present

## 2023-03-03 DIAGNOSIS — O1414 Severe pre-eclampsia complicating childbirth: Secondary | ICD-10-CM | POA: Diagnosis not present

## 2023-03-03 DIAGNOSIS — O99892 Other specified diseases and conditions complicating childbirth: Secondary | ICD-10-CM | POA: Diagnosis present

## 2023-03-03 DIAGNOSIS — Z23 Encounter for immunization: Secondary | ICD-10-CM | POA: Diagnosis not present

## 2023-03-03 DIAGNOSIS — O1490 Unspecified pre-eclampsia, unspecified trimester: Secondary | ICD-10-CM | POA: Diagnosis present

## 2023-03-03 DIAGNOSIS — Q613 Polycystic kidney, unspecified: Secondary | ICD-10-CM

## 2023-03-03 DIAGNOSIS — O365930 Maternal care for other known or suspected poor fetal growth, third trimester, not applicable or unspecified: Secondary | ICD-10-CM | POA: Diagnosis present

## 2023-03-03 DIAGNOSIS — Z8249 Family history of ischemic heart disease and other diseases of the circulatory system: Secondary | ICD-10-CM

## 2023-03-03 DIAGNOSIS — Z841 Family history of disorders of kidney and ureter: Secondary | ICD-10-CM | POA: Diagnosis not present

## 2023-03-03 DIAGNOSIS — R21 Rash and other nonspecific skin eruption: Secondary | ICD-10-CM | POA: Diagnosis present

## 2023-03-03 DIAGNOSIS — O114 Pre-existing hypertension with pre-eclampsia, complicating childbirth: Secondary | ICD-10-CM | POA: Diagnosis present

## 2023-03-03 DIAGNOSIS — O99212 Obesity complicating pregnancy, second trimester: Secondary | ICD-10-CM | POA: Diagnosis not present

## 2023-03-03 DIAGNOSIS — Z803 Family history of malignant neoplasm of breast: Secondary | ICD-10-CM

## 2023-03-03 DIAGNOSIS — O1002 Pre-existing essential hypertension complicating childbirth: Secondary | ICD-10-CM | POA: Diagnosis not present

## 2023-03-03 DIAGNOSIS — O119 Pre-existing hypertension with pre-eclampsia, unspecified trimester: Principal | ICD-10-CM

## 2023-03-03 DIAGNOSIS — O10012 Pre-existing essential hypertension complicating pregnancy, second trimester: Secondary | ICD-10-CM | POA: Diagnosis not present

## 2023-03-03 DIAGNOSIS — O99213 Obesity complicating pregnancy, third trimester: Secondary | ICD-10-CM | POA: Diagnosis not present

## 2023-03-03 DIAGNOSIS — O365920 Maternal care for other known or suspected poor fetal growth, second trimester, not applicable or unspecified: Secondary | ICD-10-CM | POA: Diagnosis not present

## 2023-03-03 DIAGNOSIS — O9912 Other diseases of the blood and blood-forming organs and certain disorders involving the immune mechanism complicating childbirth: Secondary | ICD-10-CM | POA: Diagnosis present

## 2023-03-03 DIAGNOSIS — Q612 Polycystic kidney, adult type: Secondary | ICD-10-CM | POA: Diagnosis not present

## 2023-03-03 DIAGNOSIS — Z3A28 28 weeks gestation of pregnancy: Secondary | ICD-10-CM | POA: Diagnosis not present

## 2023-03-03 DIAGNOSIS — O10013 Pre-existing essential hypertension complicating pregnancy, third trimester: Secondary | ICD-10-CM | POA: Diagnosis not present

## 2023-03-03 DIAGNOSIS — I10 Essential (primary) hypertension: Secondary | ICD-10-CM

## 2023-03-03 DIAGNOSIS — O99891 Other specified diseases and conditions complicating pregnancy: Secondary | ICD-10-CM | POA: Diagnosis not present

## 2023-03-03 DIAGNOSIS — O368130 Decreased fetal movements, third trimester, not applicable or unspecified: Secondary | ICD-10-CM | POA: Diagnosis present

## 2023-03-03 HISTORY — DX: Polycystic kidney, adult type: Q61.2

## 2023-03-03 LAB — COMPREHENSIVE METABOLIC PANEL
ALT: 23 U/L (ref 0–44)
AST: 25 U/L (ref 15–41)
Albumin: 2.5 g/dL — ABNORMAL LOW (ref 3.5–5.0)
Alkaline Phosphatase: 92 U/L (ref 38–126)
Anion gap: 8 (ref 5–15)
BUN: 17 mg/dL (ref 6–20)
CO2: 22 mmol/L (ref 22–32)
Calcium: 8.7 mg/dL — ABNORMAL LOW (ref 8.9–10.3)
Chloride: 106 mmol/L (ref 98–111)
Creatinine, Ser: 0.8 mg/dL (ref 0.44–1.00)
GFR, Estimated: >60 mL/min (ref >60)
Glucose, Bld: 91 mg/dL (ref 70–99)
Potassium: 4.2 mmol/L (ref 3.5–5.1)
Sodium: 136 mmol/L (ref 135–145)
Total Bilirubin: 0.2 mg/dL — ABNORMAL LOW (ref 0.3–1.2)
Total Protein: 5.3 g/dL — ABNORMAL LOW (ref 6.5–8.1)

## 2023-03-03 LAB — CBC
HCT: 36 % (ref 36.0–46.0)
Hemoglobin: 11.8 g/dL — ABNORMAL LOW (ref 12.0–15.0)
MCH: 31.4 pg (ref 26.0–34.0)
MCHC: 32.8 g/dL (ref 30.0–36.0)
MCV: 95.7 fL (ref 80.0–100.0)
Platelets: 191 10*3/uL (ref 150–400)
RBC: 3.76 MIL/uL — ABNORMAL LOW (ref 3.87–5.11)
RDW: 12.3 % (ref 11.5–15.5)
WBC: 14.7 10*3/uL — ABNORMAL HIGH (ref 4.0–10.5)
nRBC: 0 % (ref 0.0–0.2)

## 2023-03-03 LAB — TYPE AND SCREEN
ABO/RH(D): O NEG
Antibody Screen: NEGATIVE

## 2023-03-03 LAB — PROTEIN / CREATININE RATIO, URINE
Creatinine, Urine: 165 mg/dL
Protein Creatinine Ratio: 4.26 mg/mg{Cre} — ABNORMAL HIGH (ref 0.00–0.15)
Total Protein, Urine: 703 mg/dL

## 2023-03-03 MED ORDER — METOCLOPRAMIDE HCL 5 MG/ML IJ SOLN
10.0000 mg | Freq: Four times a day (QID) | INTRAMUSCULAR | Status: DC | PRN
  Administered 2023-03-03 – 2023-03-04 (×2): 10 mg via INTRAVENOUS
  Filled 2023-03-03 (×2): qty 2

## 2023-03-03 MED ORDER — DOCUSATE SODIUM 100 MG PO CAPS
100.0000 mg | ORAL_CAPSULE | Freq: Every day | ORAL | Status: DC
  Administered 2023-03-05 – 2023-03-09 (×4): 100 mg via ORAL
  Filled 2023-03-03 (×7): qty 1

## 2023-03-03 MED ORDER — DIPHENHYDRAMINE HCL 25 MG PO CAPS: 25.0000 mg | ORAL_CAPSULE | Freq: Four times a day (QID) | ORAL | Status: DC | PRN

## 2023-03-03 MED ORDER — METOCLOPRAMIDE HCL 5 MG/ML IJ SOLN: 10.0000 mg | Freq: Four times a day (QID) | INTRAMUSCULAR | Status: DC

## 2023-03-03 MED ORDER — NIFEDIPINE 10 MG PO CAPS
10.0000 mg | ORAL_CAPSULE | ORAL | Status: DC | PRN
  Administered 2023-03-03 – 2023-03-09 (×2): 10 mg via ORAL
  Filled 2023-03-03 (×2): qty 1

## 2023-03-03 MED ORDER — BETAMETHASONE SOD PHOS & ACET 6 (3-3) MG/ML IJ SUSP
12.0000 mg | INTRAMUSCULAR | Status: AC
  Administered 2023-03-03 – 2023-03-04 (×2): 12 mg via INTRAMUSCULAR
  Filled 2023-03-03 (×2): qty 5

## 2023-03-03 MED ORDER — LACTATED RINGERS IV BOLUS
1000.0000 mL | Freq: Once | INTRAVENOUS | Status: AC
  Administered 2023-03-03: 1000 mL via INTRAVENOUS

## 2023-03-03 MED ORDER — CALCIUM CARBONATE ANTACID 500 MG PO CHEW
2.0000 | CHEWABLE_TABLET | ORAL | Status: DC | PRN
  Administered 2023-03-07 – 2023-03-08 (×2): 400 mg via ORAL
  Filled 2023-03-03 (×2): qty 2

## 2023-03-03 MED ORDER — ASPIRIN 81 MG PO TBEC
81.0000 mg | DELAYED_RELEASE_TABLET | Freq: Every day | ORAL | Status: DC
  Administered 2023-03-04 – 2023-03-08 (×5): 81 mg via ORAL
  Filled 2023-03-03 (×7): qty 1

## 2023-03-03 MED ORDER — METOCLOPRAMIDE HCL 10 MG PO TABS
10.0000 mg | ORAL_TABLET | Freq: Four times a day (QID) | ORAL | Status: DC | PRN
  Filled 2023-03-03: qty 1

## 2023-03-03 MED ORDER — LABETALOL HCL 5 MG/ML IV SOLN: 40.0000 mg | INTRAVENOUS | Status: DC | PRN

## 2023-03-03 MED ORDER — MAGNESIUM SULFATE BOLUS VIA INFUSION
4.0000 g | Freq: Once | INTRAVENOUS | Status: AC
  Administered 2023-03-03: 4 g via INTRAVENOUS
  Filled 2023-03-03: qty 1000

## 2023-03-03 MED ORDER — PRENATAL MULTIVITAMIN CH
1.0000 | ORAL_TABLET | Freq: Every day | ORAL | Status: DC
  Filled 2023-03-03 (×4): qty 1

## 2023-03-03 MED ORDER — LACTATED RINGERS IV SOLN: 125.0000 mL/h | INTRAVENOUS | Status: AC

## 2023-03-03 MED ORDER — NIFEDIPINE 10 MG PO CAPS: 20.0000 mg | ORAL_CAPSULE | ORAL | Status: DC | PRN

## 2023-03-03 MED ORDER — ACETAMINOPHEN 325 MG PO TABS
650.0000 mg | ORAL_TABLET | ORAL | Status: DC | PRN
  Administered 2023-03-03 – 2023-03-08 (×7): 650 mg via ORAL
  Filled 2023-03-03 (×7): qty 2

## 2023-03-03 MED ORDER — MAGNESIUM SULFATE 40 GM/1000ML IV SOLN
1.0000 g/h | INTRAVENOUS | Status: AC
  Administered 2023-03-04: 1 g/h via INTRAVENOUS
  Filled 2023-03-03 (×2): qty 1000

## 2023-03-03 MED ORDER — LABETALOL HCL 200 MG PO TABS
400.0000 mg | ORAL_TABLET | Freq: Three times a day (TID) | ORAL | Status: DC
  Administered 2023-03-03 – 2023-03-04 (×3): 400 mg via ORAL
  Filled 2023-03-03 (×3): qty 2

## 2023-03-03 MED ORDER — LACTATED RINGERS IV SOLN: INTRAVENOUS | Status: DC

## 2023-03-03 NOTE — Progress Notes (Signed)
Tel call with Dr Grace Bushy who is viewing the fetal monitor. Cat 2, decreased variability on magnesium sulfate  A/P: Will continue magnesium sulfate and BMZ         If repetitive significant decelerations occur will consider delivery         He will see patient in am

## 2023-03-03 NOTE — H&P (Signed)
[name] is a 32 y.o. female G2P0010 @ [redacted]w[redacted]d here in MAU from the [facility] office for further monitoring. She has a history of CHTN and autosomal dominant polycystic kidney disease (ADPKD).  She is currently taking labetalol 400 mg TID. She has taken 2 doses of her labetalol today at 0700 and 1500.She has no headache and no changes in her vision today.    She presented to the Office today with decreased fetal movement. She has good fetal movement now while in MAU.  Reports 8/8 BPP today in primary OB office, however fetal tracing did not show a lot of movement, which is why she was sent to MAU.    She has been followed closely with MFM and primary OB office. Baby is IUGR with normal dopplers recently.    OB History       Gravida  2   Para  0   Term  0   Preterm  0   AB  1   Living  0        SAB  1   IAB  0   Ectopic  0   Multiple  0   Live Births                      Past Medical History:  Diagnosis Date   Amenorrhea     Anxiety     Asthma     Asthma 07/24/2021   Atopic dermatitis 07/24/2021   Autosomal dominant polycystic kidney disease     Family history of breast cancer 07/26/2020   Family history of breast cancer 07/26/2020   Generalized anxiety disorder 07/24/2021   Hypertension     Irritable bowel syndrome with diarrhea 07/24/2021   Migraine headache without aura 11/03/2014   Migraine without aura     Migraine without aura     Mild intermittent asthma 07/24/2021   Ovarian cyst     Ovarian cyst     Polycystic kidney disease     UTI (urinary tract infection)     Vaginal Pap smear, abnormal                 Past Surgical History:  Procedure Laterality Date   WISDOM TOOTH EXTRACTION   2012/2013               Family History  Problem Relation Age of Onset   Kidney disease Mother     Hypertension Mother     Polycystic kidney disease Mother     Hypertension Father     Hypertension Sister     Breast cancer Sister 14        invasive ductal  cancer, negative genetic testing   Diabetes Paternal Uncle     Heart attack Maternal Grandfather     Brain cancer Paternal Grandmother 52   Diabetes Paternal Grandfather     Heart attack Paternal Grandfather     Stomach cancer Paternal Grandfather 58   Breast cancer Other 44        MGM's sister   Prostate cancer Other          MGM's brother          Social History  Social History         Tobacco Use   Smoking status: Never   Smokeless tobacco: Never  Vaping Use   Vaping status: Never Used  Substance Use Topics   Alcohol use: No      Alcohol/week: 0.0 standard drinks  of alcohol   Drug use: No        Allergies:  Allergies       Allergies  Allergen Reactions   Ibuprofen Anaphylaxis      Angioedema               Medications Prior to Admission  Medication Sig Dispense Refill Last Dose   aspirin EC 81 MG tablet Take 81 mg by mouth daily. Swallow whole.     03/02/2023   labetalol (NORMODYNE) 200 MG tablet Take 1 tablet (200 mg total) by mouth 3 (three) times daily. (Patient taking differently: Take 400 mg by mouth 3 (three) times daily.) 60 tablet 2 03/03/2023 at 0245   Prenatal Vit-Fe Fumarate-FA (MULTIVITAMIN-PRENATAL) 27-0.8 MG TABS tablet Take 1 tablet by mouth daily at 12 noon.     03/02/2023   albuterol (VENTOLIN HFA) 108 (90 Base) MCG/ACT inhaler Inhale 1 puff into the lungs 4 (four) times daily as needed.         metoCLOPramide (REGLAN) 10 MG tablet Take 10 mg by mouth at bedtime.              Lab Results Last 48 Hours        Results for orders placed or performed during the hospital encounter of 03/03/23 (from the past 48 hour(s))  CBC     Status: Abnormal    Collection Time: 03/03/23  4:56 PM  Result Value Ref Range    WBC 14.7 (H) 4.0 - 10.5 K/uL    RBC 3.76 (L) 3.87 - 5.11 MIL/uL    Hemoglobin 11.8 (L) 12.0 - 15.0 g/dL    HCT 16.1 09.6 - 04.5 %    MCV 95.7 80.0 - 100.0 fL    MCH 31.4 26.0 - 34.0 pg    MCHC 32.8 30.0 - 36.0 g/dL    RDW 40.9 81.1 -  91.4 %    Platelets 191 150 - 400 K/uL    nRBC 0.0 0.0 - 0.2 %      Comment: Performed at [facility], [address], [place], [place] [address]  Comprehensive metabolic panel     Status: Abnormal    Collection Time: 03/03/23  4:56 PM  Result Value Ref Range    Sodium 136 135 - 145 mmol/L    Potassium 4.2 3.5 - 5.1 mmol/L    Chloride 106 98 - 111 mmol/L    CO2 22 22 - 32 mmol/L    Glucose, Bld 91 70 - 99 mg/dL      Comment: Glucose reference range applies only to samples taken after fasting for at least 8 hours.    BUN 17 6 - 20 mg/dL    Creatinine, Ser 6.21 0.44 - 1.00 mg/dL    Calcium 8.7 (L) 8.9 - 10.3 mg/dL    Total Protein 5.3 (L) 6.5 - 8.1 g/dL    Albumin 2.5 (L) 3.5 - 5.0 g/dL    AST 25 15 - 41 U/L    ALT 23 0 - 44 U/L    Alkaline Phosphatase 92 38 - 126 U/L    Total Bilirubin 0.2 (L) 0.3 - 1.2 mg/dL    GFR, Estimated >30 >86 mL/min      Comment: (NOTE) Calculated using the CKD-EPI Creatinine Equation (2021)      Anion gap 8 5 - 15      Comment: Performed at [facility], [address], [place], [place] [address]  Protein / creatinine ratio, urine  Status: Abnormal    Collection Time: 03/03/23  5:53 PM  Result Value Ref Range    Creatinine, Urine 165 mg/dL    Total Protein, Urine 703 mg/dL      Comment: RESULTS CONFIRMED BY MANUAL DILUTION NO NORMAL RANGE ESTABLISHED FOR THIS TEST      Protein Creatinine Ratio 4.26 (H) 0.00 - 0.15 mg/mg[Cre]      Comment: Performed at [facility], [address] [place], [place] [address]      Review of Systems  Constitutional:  Negative for fever.  Eyes:  Negative for photophobia and visual disturbance.  Neurological:  Negative for headaches.    Physical Exam    Blood pressure (!) 150/101, pulse 85, temperature 98.3 F (36.8 C), resp. rate 18, height 5' (1.524 m), weight 88.9 kg, last menstrual period 08/18/2022, SpO2 100%, unknown if currently breastfeeding.   Patient Vitals for the past 24 hrs:    BP Temp Pulse Resp SpO2 Height Weight  03/03/23 1900 (!) 143/89 -- 86 -- 99 % -- --  03/03/23 1855 (!) 145/91 -- 81 -- 100 % -- --  03/03/23 1823 (!) 167/103 -- 73 -- 99 % -- --  03/03/23 1815 (!) 163/99 -- 73 -- 100 % -- --  03/03/23 1800 (!) 161/100 -- 71 -- 100 % -- --  03/03/23 1730 (!) 142/78 -- 71 -- 100 % -- --  03/03/23 1725 135/76 -- 73 -- 100 % -- --  03/03/23 1710 (!) 157/89 -- 83 -- 100 % -- --  03/03/23 1627 (!) 150/101 98.3 F (36.8 C) 85 18 100 % 5' (1.524 m) 88.9 kg      Physical Exam Constitutional:      General: She is not in acute distress.    Appearance: Normal appearance. She is not ill-appearing, toxic-appearing or diaphoretic.  Neurological:     Mental Status: She is alert and oriented to person, place, and time.     Deep Tendon Reflexes:     Reflex Scores:      Patellar reflexes are 2+ on the right side and 2+ on the left side.    Comments: Negative clonus   Psychiatric:        Behavior: Behavior normal.      Fetal Tracing: Baseline: 130 bpm Variability:Minimal  Accelerations: 10x10 Decelerations: variable decelerations  Toco: None.   MAU Course  Procedures   MDM   Lr bolus at the time of arrival due to back to back variable decelerations on fetal tracing Severe range BP's treated with oral procardia Patient remains asymptomatic. Discussed patient with Dr. Henderson Cloud who is coming down to MAU to see the patient. Reviewed labs with Dr. Henderson Cloud    Assessment and Plan    A:   1. Chronic hypertension with superimposed preeclampsia   2. [redacted] weeks gestation of pregnancy   3. Polycystic kidney disease   4. Chronic hypertension       P:   Admit to Ante Dr. Henderson Cloud to manage going forward   Rasch, Harolyn Rutherford, NP 03/03/2023 7:25 PM             Agree with above Rebecca [name] states FM not as vigourous over last several days, especially last pm and today. She denies HA or vision changes. She generally doesn't feel well.  She was admitted to the  hospital 02/13/23 for HA and elevated BP. PCR at that time was 1.19.  She did not receive BMZ and was discharged 02/19/23 @ 25 6/7  on labetalol 200mg  TID.   U/S with Dr Parke Poisson, MFM, 02/25/23 @ 26 5/7 wks noted EFW 1# 12 oz (799 gms) at the 5th percentile. Umbilical artery doppler noted elevated S/D ratio of 5.67 but no absent or reversed end-diastolic flow. She was on labetalol 300mg  TID at that time.  Today's Vitals   03/03/23 1916 03/03/23 1920 03/03/23 1925 03/03/23 1932  BP: (!) 152/96   (!) 144/94  Pulse: 91   85  Resp:      Temp:      SpO2:  99% 99% 99%  Weight:      Height:      PainSc:       Body mass index is 38.28 kg/m.   Abdomen NT DTR 3+ without clonus  A/P: Chronic HTN         Superimposed preeclampsia with severe features         PCKD         IUGR         D/W Dr Grace Bushy, MFM. Will admit to antenatal, start magnesium sulfate infusion for seizure prophylaxis and neuroprotection, start BMZ series, continue labetalol. If repetitive decelerations will move to delivery.

## 2023-03-03 NOTE — MAU Provider Note (Signed)
History     CSN: [id number]  Arrival date and time: 03/03/23 1607   Event Date/Time   First Provider Initiated Contact with Patient 03/03/23 1656      Chief Complaint  Patient presents with   Hypertension   Decreased Fetal Movement   HPI  Rebecca [name] is a 32 y.o. female G2P0010 @ [redacted]w[redacted]d here in MAU from the [facility] office for further monitoring. She has a history of CHTN and autosomal dominant polycystic kidney disease (ADPKD).  She is currently taking labetalol 400 mg TID. She has taken 2 doses of her labetalol today at 0700 and 1500.She has no headache and no changes in her vision today.   She presented to the Office today with decreased fetal movement. She has good fetal movement now while in MAU.  Reports 8/8 BPP today in primary OB office, however fetal tracing did not show a lot of movement, which is why she was sent to MAU.   She has been followed closely with MFM and primary OB office. Baby is IUGR with normal dopplers recently.   OB History     Gravida  2   Para  0   Term  0   Preterm  0   AB  1   Living  0      SAB  1   IAB  0   Ectopic  0   Multiple  0   Live Births              Past Medical History:  Diagnosis Date   Amenorrhea    Anxiety    Asthma    Asthma 07/24/2021   Atopic dermatitis 07/24/2021   Autosomal dominant polycystic kidney disease    Family history of breast cancer 07/26/2020   Family history of breast cancer 07/26/2020   Generalized anxiety disorder 07/24/2021   Hypertension    Irritable bowel syndrome with diarrhea 07/24/2021   Migraine headache without aura 11/03/2014   Migraine without aura    Migraine without aura    Mild intermittent asthma 07/24/2021   Ovarian cyst    Ovarian cyst    Polycystic kidney disease    UTI (urinary tract infection)    Vaginal Pap smear, abnormal     Past Surgical History:  Procedure Laterality Date   WISDOM TOOTH EXTRACTION  2012/2013    Family History  Problem Relation Age  of Onset   Kidney disease Mother    Hypertension Mother    Polycystic kidney disease Mother    Hypertension Father    Hypertension Sister    Breast cancer Sister 5       invasive ductal cancer, negative genetic testing   Diabetes Paternal Uncle    Heart attack Maternal Grandfather    Brain cancer Paternal Grandmother 68   Diabetes Paternal Grandfather    Heart attack Paternal Grandfather    Stomach cancer Paternal Grandfather 76   Breast cancer Other 54       MGM's sister   Prostate cancer Other        MGM's brother    Social History   Tobacco Use   Smoking status: Never   Smokeless tobacco: Never  Vaping Use   Vaping status: Never Used  Substance Use Topics   Alcohol use: No    Alcohol/week: 0.0 standard drinks of alcohol   Drug use: No    Allergies:  Allergies  Allergen Reactions   Ibuprofen Anaphylaxis    Angioedema    Medications Prior  to Admission  Medication Sig Dispense Refill Last Dose   aspirin EC 81 MG tablet Take 81 mg by mouth daily. Swallow whole.   03/02/2023   labetalol (NORMODYNE) 200 MG tablet Take 1 tablet (200 mg total) by mouth 3 (three) times daily. (Patient taking differently: Take 400 mg by mouth 3 (three) times daily.) 60 tablet 2 03/03/2023 at 0245   Prenatal Vit-Fe Fumarate-FA (MULTIVITAMIN-PRENATAL) 27-0.8 MG TABS tablet Take 1 tablet by mouth daily at 12 noon.   03/02/2023   albuterol (VENTOLIN HFA) 108 (90 Base) MCG/ACT inhaler Inhale 1 puff into the lungs 4 (four) times daily as needed.      metoCLOPramide (REGLAN) 10 MG tablet Take 10 mg by mouth at bedtime.      Results for orders placed or performed during the hospital encounter of 03/03/23 (from the past 48 hour(s))  CBC     Status: Abnormal   Collection Time: 03/03/23  4:56 PM  Result Value Ref Range   WBC 14.7 (H) 4.0 - 10.5 K/uL   RBC 3.76 (L) 3.87 - 5.11 MIL/uL   Hemoglobin 11.8 (L) 12.0 - 15.0 g/dL   HCT 16.1 09.6 - 04.5 %   MCV 95.7 80.0 - 100.0 fL   MCH 31.4 26.0 - 34.0  pg   MCHC 32.8 30.0 - 36.0 g/dL   RDW 40.9 81.1 - 91.4 %   Platelets 191 150 - 400 K/uL   nRBC 0.0 0.0 - 0.2 %    Comment: Performed at [facility], [address], [place], [place] [address]  Comprehensive metabolic panel     Status: Abnormal   Collection Time: 03/03/23  4:56 PM  Result Value Ref Range   Sodium 136 135 - 145 mmol/L   Potassium 4.2 3.5 - 5.1 mmol/L   Chloride 106 98 - 111 mmol/L   CO2 22 22 - 32 mmol/L   Glucose, Bld 91 70 - 99 mg/dL    Comment: Glucose reference range applies only to samples taken after fasting for at least 8 hours.   BUN 17 6 - 20 mg/dL   Creatinine, Ser 6.21 0.44 - 1.00 mg/dL   Calcium 8.7 (L) 8.9 - 10.3 mg/dL   Total Protein 5.3 (L) 6.5 - 8.1 g/dL   Albumin 2.5 (L) 3.5 - 5.0 g/dL   AST 25 15 - 41 U/L   ALT 23 0 - 44 U/L   Alkaline Phosphatase 92 38 - 126 U/L   Total Bilirubin 0.2 (L) 0.3 - 1.2 mg/dL   GFR, Estimated >30 >86 mL/min    Comment: (NOTE) Calculated using the CKD-EPI Creatinine Equation (2021)    Anion gap 8 5 - 15    Comment: Performed at [facility], [address], [place], [place] [address]  Protein / creatinine ratio, urine     Status: Abnormal   Collection Time: 03/03/23  5:53 PM  Result Value Ref Range   Creatinine, Urine 165 mg/dL   Total Protein, Urine 703 mg/dL    Comment: RESULTS CONFIRMED BY MANUAL DILUTION NO NORMAL RANGE ESTABLISHED FOR THIS TEST    Protein Creatinine Ratio 4.26 (H) 0.00 - 0.15 mg/mg[Cre]    Comment: Performed at [facility], [address], [place], [place] [address]    Review of Systems  Constitutional:  Negative for fever.  Eyes:  Negative for photophobia and visual disturbance.  Neurological:  Negative for headaches.   Physical Exam   Blood pressure (!) 150/101, pulse 85, temperature  98.3 F (36.8 C), resp. rate 18, height 5' (1.524 m), weight 88.9 kg, last menstrual period 08/18/2022, SpO2 100%, unknown if currently breastfeeding.  Patient Vitals for the past 24  hrs:  BP Temp Pulse Resp SpO2 Height Weight  03/03/23 1900 (!) 143/89 -- 86 -- 99 % -- --  03/03/23 1855 (!) 145/91 -- 81 -- 100 % -- --  03/03/23 1823 (!) 167/103 -- 73 -- 99 % -- --  03/03/23 1815 (!) 163/99 -- 73 -- 100 % -- --  03/03/23 1800 (!) 161/100 -- 71 -- 100 % -- --  03/03/23 1730 (!) 142/78 -- 71 -- 100 % -- --  03/03/23 1725 135/76 -- 73 -- 100 % -- --  03/03/23 1710 (!) 157/89 -- 83 -- 100 % -- --  03/03/23 1627 (!) 150/101 98.3 F (36.8 C) 85 18 100 % 5' (1.524 m) 88.9 kg     Physical Exam Constitutional:      General: She is not in acute distress.    Appearance: Normal appearance. She is not ill-appearing, toxic-appearing or diaphoretic.  Neurological:     Mental Status: She is alert and oriented to person, place, and time.     Deep Tendon Reflexes:     Reflex Scores:      Patellar reflexes are 2+ on the right side and 2+ on the left side.    Comments: Negative clonus   Psychiatric:        Behavior: Behavior normal.    Fetal Tracing: Baseline: 130 bpm Variability:Minimal  Accelerations: 10x10 Decelerations: variable decelerations  Toco: None.  MAU Course  Procedures  MDM  Lr bolus at the time of arrival due to back to back variable decelerations on fetal tracing Severe range BP's treated with oral procardia Patient remains asymptomatic. Discussed patient with Dr. Henderson Cloud who is coming down to MAU to see the patient. Reviewed labs with Dr. Henderson Cloud   Assessment and Plan   A:  1. Chronic hypertension with superimposed preeclampsia   2. [redacted] weeks gestation of pregnancy   3. Polycystic kidney disease   4. Chronic hypertension      P:  Admit to Ante Dr. Henderson Cloud to manage going forward  Teena Mangus, Harolyn Rutherford, NP 03/03/2023 7:25 PM

## 2023-03-03 NOTE — MAU Note (Signed)
Rebecca [name] is a 32 y.o. at [redacted]w[redacted]d here in MAU reporting: between last night and this morning, noted a decrease in fetal movement. Went in to office,  wasn't movement as much on monitor, on [place], her BPP was 8/8.  Also has high blood pressure, in on medication. No pain , no bleeding or leaking. Denies HA, visual changes, epigastric pain, swelling has increased- improves when she elevates her legs.  Onset of complaint: last night Pain score: no pian Vitals:   03/03/23 1627  BP: (!) 150/101  Pulse: 85  Resp: 18  Temp: 98.3 F (36.8 C)  SpO2: 100%     FHT:140 Lab orders placed from triage:  urine collected

## 2023-03-04 DIAGNOSIS — O112 Pre-existing hypertension with pre-eclampsia, second trimester: Secondary | ICD-10-CM | POA: Diagnosis not present

## 2023-03-04 DIAGNOSIS — O10013 Pre-existing essential hypertension complicating pregnancy, third trimester: Secondary | ICD-10-CM

## 2023-03-04 DIAGNOSIS — Z3A27 27 weeks gestation of pregnancy: Secondary | ICD-10-CM | POA: Diagnosis not present

## 2023-03-04 LAB — COMPREHENSIVE METABOLIC PANEL
ALT: 25 U/L (ref 0–44)
AST: 32 U/L (ref 15–41)
Albumin: 2.6 g/dL — ABNORMAL LOW (ref 3.5–5.0)
Alkaline Phosphatase: 102 U/L (ref 38–126)
Anion gap: 11 (ref 5–15)
BUN: 12 mg/dL (ref 6–20)
CO2: 19 mmol/L — ABNORMAL LOW (ref 22–32)
Calcium: 7.8 mg/dL — ABNORMAL LOW (ref 8.9–10.3)
Chloride: 102 mmol/L (ref 98–111)
Creatinine, Ser: 0.69 mg/dL (ref 0.44–1.00)
GFR, Estimated: >60 mL/min (ref >60)
Glucose, Bld: 129 mg/dL — ABNORMAL HIGH (ref 70–99)
Potassium: 5 mmol/L (ref 3.5–5.1)
Sodium: 132 mmol/L — ABNORMAL LOW (ref 135–145)
Total Bilirubin: 0.4 mg/dL (ref 0.3–1.2)
Total Protein: 5.7 g/dL — ABNORMAL LOW (ref 6.5–8.1)

## 2023-03-04 LAB — CBC
HCT: 37.7 % (ref 36.0–46.0)
Hemoglobin: 12.5 g/dL (ref 12.0–15.0)
MCH: 31 pg (ref 26.0–34.0)
MCHC: 33.2 g/dL (ref 30.0–36.0)
MCV: 93.5 fL (ref 80.0–100.0)
Platelets: 188 10*3/uL (ref 150–400)
RBC: 4.03 MIL/uL (ref 3.87–5.11)
RDW: 12.1 % (ref 11.5–15.5)
WBC: 18.8 10*3/uL — ABNORMAL HIGH (ref 4.0–10.5)
nRBC: 0 % (ref 0.0–0.2)

## 2023-03-04 LAB — MAGNESIUM: Magnesium: 6.7 mg/dL (ref 1.7–2.4)

## 2023-03-04 MED ORDER — LABETALOL HCL 200 MG PO TABS
200.0000 mg | ORAL_TABLET | Freq: Three times a day (TID) | ORAL | Status: DC
  Administered 2023-03-04: 200 mg via ORAL
  Filled 2023-03-04: qty 1

## 2023-03-04 MED ORDER — HYDROXYZINE HCL 50 MG PO TABS
25.0000 mg | ORAL_TABLET | Freq: Once | ORAL | Status: AC
  Administered 2023-03-04: 25 mg via ORAL
  Filled 2023-03-04: qty 1

## 2023-03-04 NOTE — Consult Note (Signed)
MFM Consultation  Rebecca [name] is a 32 yo G2P0 at 66 w 5 d with and EDD of 05/29/23 who is seen today at the request of Dr. Harold Hedge for suspected superimposed preeclampsia with sever features.  Rebecca [name] has known history of chronic kidney disease secondary to polycystic kidney disease. No known urinary protein measure prior to 2024.   In addition, she has hypertension on Labetalol 400 mg TID. She was recently admitted given a new rise her her blood pressure requiring immediate release procardia- consistent with hypertension protocol for severe pressures.  Her labs are normal however her UPC has increased from 1.19 to 4.26 and a 24 hour urine of 2546 mg./day (9/10) Her CBC and CMP are normal.  She has received her first dose of BMZ and is currently on Magnesium sulfate.   The fetal tracing is reassuring with minimal to moderate variable and occasional mild accelerations and variable decelerations. The fetal tracing is consistent with a 28 week baby affected by magnesium.   She reports that she has had a mild headache roughly 1out of 10 and some nausea and vomiting (twice).  Her most recently blood pressure are below 140/90.   Lastly the pregnancy is complicated by fetal growth restriction with an EFW of 4.8 on 02/25/23. There is normal amniotic fluid volume with normal UA Dopplers. The pregnancy is also complicated by MSAFP 3.04 MoM.      03/04/2023    3:26 PM 03/04/2023   11:40 AM 03/04/2023    7:42 AM  Vitals with BMI  Systolic 114 117 098  Diastolic 75 74 73  Pulse 72 77 74       Latest Ref Rng & Units 03/04/2023    4:58 AM 03/03/2023    4:56 PM 02/17/2023    9:03 AM  CBC  WBC 4.0 - 10.5 K/uL 18.8  14.7  12.9   Hemoglobin 12.0 - 15.0 g/dL 11.9  14.7  82.9   Hematocrit 36.0 - 46.0 % 37.7  36.0  35.3   Platelets 150 - 400 K/uL 188  191  156       Latest Ref Rng & Units 03/04/2023    4:58 AM 03/03/2023    4:56 PM 02/17/2023    9:03 AM  CMP  Glucose 70 - 99 mg/dL 562  91   97   BUN 6 - 20 mg/dL 12  17  16    Creatinine 0.44 - 1.00 mg/dL 1.30  8.65  7.84   Sodium 135 - 145 mmol/L 132  136  136   Potassium 3.5 - 5.1 mmol/L 5.0  4.2  4.3   Chloride 98 - 111 mmol/L 102  106  109   CO2 22 - 32 mmol/L 19  22  19    Calcium 8.9 - 10.3 mg/dL 7.8  8.7  8.9   Total Protein 6.5 - 8.1 g/dL 5.7  5.3  5.6   Total Bilirubin 0.3 - 1.2 mg/dL 0.4  0.2  0.2   Alkaline Phos 38 - 126 U/L 102  92  79   AST 15 - 41 U/L 32  25  21   ALT 0 - 44 U/L 25  23  20     Past Medical History:  Diagnosis Date   Amenorrhea    Anxiety    Asthma    Asthma 07/24/2021   Atopic dermatitis 07/24/2021   Autosomal dominant polycystic kidney disease    Family history of breast cancer 07/26/2020   Family history of breast  cancer 07/26/2020   Generalized anxiety disorder 07/24/2021   Hypertension    Irritable bowel syndrome with diarrhea 07/24/2021   Migraine headache without aura 11/03/2014   Migraine without aura    Migraine without aura    Mild intermittent asthma 07/24/2021   Ovarian cyst    Ovarian cyst    Polycystic kidney disease    UTI (urinary tract infection)    Vaginal Pap smear, abnormal    Past Surgical History:  Procedure Laterality Date   WISDOM TOOTH EXTRACTION  2012/2013   Family History  Problem Relation Age of Onset   Kidney disease Mother    Hypertension Mother    Polycystic kidney disease Mother    Hypertension Father    Hypertension Sister    Breast cancer Sister 16       invasive ductal cancer, negative genetic testing   Diabetes Paternal Uncle    Heart attack Maternal Grandfather    Brain cancer Paternal Grandmother 22   Diabetes Paternal Grandfather    Heart attack Paternal Grandfather    Stomach cancer Paternal Grandfather 28   Breast cancer Other 30       MGM's sister   Prostate cancer Other        MGM's brother   Social History   Socioeconomic History   Marital status: Married    Spouse name: Not on file   Number of children: Not on file    Years of education: Not on file   Highest education level: Not on file  Occupational History   Not on file  Tobacco Use   Smoking status: Never   Smokeless tobacco: Never  Vaping Use   Vaping status: Never Used  Substance and Sexual Activity   Alcohol use: No    Alcohol/week: 0.0 standard drinks of alcohol   Drug use: No   Sexual activity: Not Currently    Partners: Male  Other Topics Concern   Not on file  Social History Narrative   Not on file   Social Determinants of Health   Financial Resource Strain: Not on file  Food Insecurity: No Food Insecurity (03/03/2023)   Hunger Vital Sign    Worried About Running Out of Food in the Last Year: Never true    Ran Out of Food in the Last Year: Never true  Transportation Needs: No Transportation Needs (03/03/2023)   PRAPARE - Administrator, Civil Service (Medical): No    Lack of Transportation (Non-Medical): No  Physical Activity: Not on file  Stress: Not on file  Social Connections: Not on file  Intimate Partner Violence: Not At Risk (03/03/2023)   Humiliation, Afraid, Rape, and Kick questionnaire    Fear of Current or Ex-Partner: No    Emotionally Abused: No    Physically Abused: No    Sexually Abused: No    Current Facility-Administered Medications (Endocrine & Metabolic):    betamethasone acetate-betamethasone sodium phosphate (CELESTONE) injection 12 mg   Current Facility-Administered Medications (Cardiovascular):    NIFEdipine (PROCARDIA) capsule 10 mg **AND** NIFEdipine (PROCARDIA) capsule 20 mg **AND** NIFEdipine (PROCARDIA) capsule 20 mg **AND** labetalol (NORMODYNE) injection 40 mg **AND** Measure blood pressure   labetalol (NORMODYNE) tablet 400 mg   Current Facility-Administered Medications (Respiratory):    diphenhydrAMINE (BENADRYL) capsule 25 mg   Current Facility-Administered Medications (Analgesics):    acetaminophen (TYLENOL) tablet 650 mg   aspirin EC tablet 81 mg     Current  Facility-Administered Medications (Other):    calcium carbonate (TUMS -  dosed in mg elemental calcium) chewable tablet 400 mg of elemental calcium   docusate sodium (COLACE) capsule 100 mg   lactated ringers infusion   magnesium sulfate 40 grams in SWI 1000 mL OB infusion   metoCLOPramide (REGLAN) injection 10 mg   metoCLOPramide (REGLAN) tablet 10 mg   prenatal multivitamin tablet 1 tablet  No current outpatient medications on file. Allergies  Allergen Reactions   Ibuprofen Anaphylaxis    Angioedema   Impression/Counseling:  Superimposed preeclampsia with severe features.  I discussed with Rebecca [name] the diagnosis, evaluation and management of superimposed preeclampsia.  I discussed the her new requirement of increased medical therapy and requirement of immediate release therapy to manage severe range blood meets the criteria as noted above.   We discussed that given her labs a normal besides her proteinuria, inpatient expectant management is reasonable with plan for delivery at 34 weeks.   Criteria for sooner delivery includes: Fetal oligohydramnios Poor fetal testing  Abnormal labs including thrombocytopenia, elevated transaminates or a creatinine > 1.1. In addition a persistent headache not resolved with tylenol etc. Please  ACOG bulletin criteria for further details.   At this time fetal testing 2x daily NST 2x weekly Fluid check with UA Dopplers, change to BPP at 32 weeks. Repeat growth every 2-3 weeks.   Repeat a course of BMZ if delivery is imminent beyond 7 days since completion of  the first course.  Lastly, consider titrating antihypertensive therapy to have an avg BP 135/85 mmHg. Blood pressure below 120/80 may result in a low pulse pressure to transfuse the placenta.  I discussed these recommendations with Dr Henderson Cloud and Dr. Langston Masker.    I spent 60 minutes with in counseling, review of records and care coordination.   Novella Olive, MD

## 2023-03-04 NOTE — Progress Notes (Addendum)
BPs are persistently below 135/85.  Will decrease labetalol 400 TID to 200 TID per Dr. Grace Bushy recommendation that BP <120/80 may results in a low pulse pressure to transfuse the placenta.  Also, modified BPP with Dopplers ordered.     03/04/2023    3:26 PM 03/04/2023   11:40 AM 03/04/2023    7:42 AM  Vitals with BMI  Systolic 114 117 161  Diastolic 75 74 73  Pulse 72 77 74     [name], DO

## 2023-03-04 NOTE — Progress Notes (Addendum)
Patient ID: Rebecca [name], female   DOB: [birth year], 32 y.o.   MRN: [id number] Ante Note HD2  Patient denies acute events overnight. Mild HA from this am completely resolved with Tylenol.  No vision change, RUQ pain, CP/SOB.  Dr. Grace Bushy rounded on patient recently.  +FM.  No CTX, LOF or VB.     03/04/2023    7:42 AM 03/04/2023    4:00 AM 03/04/2023    3:00 AM  Vitals with BMI  Systolic 112 123 962  Diastolic 73 82 63  Pulse 74 75 74      Latest Ref Rng & Units 03/04/2023    4:58 AM 03/03/2023    4:56 PM 02/17/2023    9:03 AM  CBC  WBC 4.0 - 10.5 K/uL 18.8  14.7  12.9   Hemoglobin 12.0 - 15.0 g/dL 95.2  84.1  32.4   Hematocrit 36.0 - 46.0 % 37.7  36.0  35.3   Platelets 150 - 400 K/uL 188  191  156       Latest Ref Rng & Units 03/04/2023    4:58 AM 03/03/2023    4:56 PM 02/17/2023    9:03 AM  CMP  Glucose 70 - 99 mg/dL 401  91  97   BUN 6 - 20 mg/dL 12  17  16    Creatinine 0.44 - 1.00 mg/dL 0.27  2.53  6.64   Sodium 135 - 145 mmol/L 132  136  136   Potassium 3.5 - 5.1 mmol/L 5.0  4.2  4.3   Chloride 98 - 111 mmol/L 102  106  109   CO2 22 - 32 mmol/L 19  22  19    Calcium 8.9 - 10.3 mg/dL 7.8  8.7  8.9   Total Protein 6.5 - 8.1 g/dL 5.7  5.3  5.6   Total Bilirubin 0.3 - 1.2 mg/dL 0.4  0.2  0.2   Alkaline Phos 38 - 126 U/L 102  92  79   AST 15 - 41 U/L 32  25  21   ALT 0 - 44 U/L 25  23  20     Gen: A&O X 3 Abd: soft, ND Ext: 1+ edema, 1+DTRs, no clonus  FHT 120 with minimal variability; no accels or decls  32yo G2P0010 at [redacted]w[redacted]d with super-imposed Pre-E with severe features (BPs), IUGR with elevated Dopplers -Receiving magnesium sulfate for seizure ppx and neuroprotection (decreased to 1 g this am).  Will continue through BMZ maturity -Labetalol 400 TID.  Has not required procardia since in MAU last night (10 mg x 1 dose) -BPs now in normal range -BMZ#2 due tonight -Labs wnl but P:C was 4.26 on admit -Plan weekly labs -Once BMZ mature, BID NST -BPP and Doppler's  2x/wk -Growth u/s q 2-3 wks -Deliver by 34 weeks; sooner prn neuro symptoms, worsening labs, non-reassuring fetal status, etc.  Mitchel Honour, DO

## 2023-03-05 ENCOUNTER — Inpatient Hospital Stay (HOSPITAL_COMMUNITY): Payer: BC Managed Care – PPO

## 2023-03-05 ENCOUNTER — Ambulatory Visit: Payer: BC Managed Care – PPO

## 2023-03-05 DIAGNOSIS — O365920 Maternal care for other known or suspected poor fetal growth, second trimester, not applicable or unspecified: Secondary | ICD-10-CM | POA: Diagnosis not present

## 2023-03-05 DIAGNOSIS — O99212 Obesity complicating pregnancy, second trimester: Secondary | ICD-10-CM | POA: Diagnosis not present

## 2023-03-05 DIAGNOSIS — O10012 Pre-existing essential hypertension complicating pregnancy, second trimester: Secondary | ICD-10-CM

## 2023-03-05 DIAGNOSIS — O281 Abnormal biochemical finding on antenatal screening of mother: Secondary | ICD-10-CM | POA: Diagnosis not present

## 2023-03-05 DIAGNOSIS — R772 Abnormality of alphafetoprotein: Secondary | ICD-10-CM

## 2023-03-05 DIAGNOSIS — Z3A27 27 weeks gestation of pregnancy: Secondary | ICD-10-CM

## 2023-03-05 DIAGNOSIS — E669 Obesity, unspecified: Secondary | ICD-10-CM

## 2023-03-05 MED ORDER — ZOLPIDEM TARTRATE 5 MG PO TABS
5.0000 mg | ORAL_TABLET | Freq: Every evening | ORAL | Status: DC | PRN
  Administered 2023-03-05 – 2023-03-08 (×5): 5 mg via ORAL
  Filled 2023-03-05 (×5): qty 1

## 2023-03-05 MED ORDER — LABETALOL HCL 200 MG PO TABS
300.0000 mg | ORAL_TABLET | Freq: Three times a day (TID) | ORAL | Status: DC
  Administered 2023-03-05 – 2023-03-07 (×8): 300 mg via ORAL
  Filled 2023-03-05 (×8): qty 1

## 2023-03-05 NOTE — Progress Notes (Signed)
Patient ID: Herman Hemric, female   DOB: 1991-04-01, 32 y.o.   MRN: [id number] Ante Note [facility]  Patient denies acute events overnight. No current HA.  No vision change, RUQ pain, CP/SOB. +FM.  No CTX, LOF or VB. Continues to feel anxious. Was able to sleep last night only after ambien.      03/05/2023    8:09 AM 03/05/2023    4:52 AM 03/04/2023   11:22 PM  Vitals with BMI  Systolic 119 132 409  Diastolic 74 83 88  Pulse 80 75 76      Latest Ref Rng & Units 03/04/2023    4:58 AM 03/03/2023    4:56 PM 02/17/2023    9:03 AM  CBC  WBC 4.0 - 10.5 K/uL 18.8  14.7  12.9   Hemoglobin 12.0 - 15.0 g/dL 81.1  91.4  78.2   Hematocrit 36.0 - 46.0 % 37.7  36.0  35.3   Platelets 150 - 400 K/uL 188  191  156       Latest Ref Rng & Units 03/04/2023    4:58 AM 03/03/2023    4:56 PM 02/17/2023    9:03 AM  CMP  Glucose 70 - 99 mg/dL 956  91  97   BUN 6 - 20 mg/dL 12  17  16    Creatinine 0.44 - 1.00 mg/dL 2.13  0.86  5.78   Sodium 135 - 145 mmol/L 132  136  136   Potassium 3.5 - 5.1 mmol/L 5.0  4.2  4.3   Chloride 98 - 111 mmol/L 102  106  109   CO2 22 - 32 mmol/L 19  22  19    Calcium 8.9 - 10.3 mg/dL 7.8  8.7  8.9   Total Protein 6.5 - 8.1 g/dL 5.7  5.3  5.6   Total Bilirubin 0.3 - 1.2 mg/dL 0.4  0.2  0.2   Alkaline Phos 38 - 126 U/L 102  92  79   AST 15 - 41 U/L 32  25  21   ALT 0 - 44 U/L 25  23  20     Gen: A&O X 3 Abd: soft, ND Ext: 1+ edema  FHT 120 with minimal variability; no accels or decls  32yo G2P0010 at [redacted]w[redacted]d with super-imposed Pre-E with severe features (BPs), IUGR with elevated Dopplers -Receiving magnesium sulfate for seizure ppx and neuroprotection (1 g/hr).  Will continue through BMZ maturity which will be this evening -Labetalol 400 TID --> 200 TID --> 300 TID -BPs now in normal range -Labs wnl but P:C was 4.26 on admit -Plan weekly labs -Once BMZ mature (TONIGHT), BID NST -BPP  (starting at 32 wga) and Doppler's (now)2x/wk - ordered today for modified BPP and  dopplers -Growth u/s q 2-3 wks -Deliver by 34 weeks; sooner prn neuro symptoms, worsening labs, non-reassuring fetal status, etc. - appreciate MFM recs  Belva Agee MD

## 2023-03-05 NOTE — Progress Notes (Signed)
Dopplers elevation discussed with patient at bedside. I discussed dopplers and what each result means. Dr. Grace Bushy recommends dopplers three times a week since elevated and NST BID when off Magnesium tonight. This was also discussed with patient. Questions answered.

## 2023-03-06 ENCOUNTER — Inpatient Hospital Stay (HOSPITAL_BASED_OUTPATIENT_CLINIC_OR_DEPARTMENT_OTHER): Payer: BC Managed Care – PPO

## 2023-03-06 DIAGNOSIS — Z3A28 28 weeks gestation of pregnancy: Secondary | ICD-10-CM

## 2023-03-06 DIAGNOSIS — O99213 Obesity complicating pregnancy, third trimester: Secondary | ICD-10-CM

## 2023-03-06 DIAGNOSIS — O281 Abnormal biochemical finding on antenatal screening of mother: Secondary | ICD-10-CM

## 2023-03-06 DIAGNOSIS — O365930 Maternal care for other known or suspected poor fetal growth, third trimester, not applicable or unspecified: Secondary | ICD-10-CM

## 2023-03-06 DIAGNOSIS — O10013 Pre-existing essential hypertension complicating pregnancy, third trimester: Secondary | ICD-10-CM

## 2023-03-06 DIAGNOSIS — E669 Obesity, unspecified: Secondary | ICD-10-CM

## 2023-03-06 MED ORDER — POLYETHYLENE GLYCOL 3350 17 G PO PACK
17.0000 g | PACK | Freq: Every day | ORAL | Status: DC | PRN
  Filled 2023-03-06: qty 1

## 2023-03-06 NOTE — Progress Notes (Addendum)
Brief Progress Note  Notified by RN that patient complaining of "rash" on bilateral upper arms. I presented to bedside to assess. Patient feeling well, no other symptoms. Erythema on bilateral upper arms, nonpruritic, patient reports mild sore sensation. This is similar to what she experienced while on magnesium. We reviewed ice and continued monitoring.  We additionally reviewed initial plan for dopplers Saturday, but [place] was performed today. Dopplers remain elevated without AEDF or REDF, which is reassuring. Plan to repeat Monday - MWF dopplers. All questions answered.  Jule Economy, MD

## 2023-03-06 NOTE — Progress Notes (Signed)
Patient ID: Rebecca [name], female   DOB: 1990/09/20, 32 y.o.   MRN: [id number] Antepartum Progress Note HD4  Reports feeling well this morning, better now that she is off the magnesium. No HA, RUQ pain, vision changes. Bps stable overnight.     03/06/2023    4:06 AM 03/05/2023   11:39 PM 03/05/2023    8:04 PM  Vitals with BMI  Systolic 115 149 409  Diastolic 65 90 92  Pulse 78 70 75      Latest Ref Rng & Units 03/04/2023    4:58 AM 03/03/2023    4:56 PM 02/17/2023    9:03 AM  CBC  WBC 4.0 - 10.5 K/uL 18.8  14.7  12.9   Hemoglobin 12.0 - 15.0 g/dL 81.1  91.4  78.2   Hematocrit 36.0 - 46.0 % 37.7  36.0  35.3   Platelets 150 - 400 K/uL 188  191  156       Latest Ref Rng & Units 03/04/2023    4:58 AM 03/03/2023    4:56 PM 02/17/2023    9:03 AM  CMP  Glucose 70 - 99 mg/dL 956  91  97   BUN 6 - 20 mg/dL 12  17  16    Creatinine 0.44 - 1.00 mg/dL 2.13  0.86  5.78   Sodium 135 - 145 mmol/L 132  136  136   Potassium 3.5 - 5.1 mmol/L 5.0  4.2  4.3   Chloride 98 - 111 mmol/L 102  106  109   CO2 22 - 32 mmol/L 19  22  19    Calcium 8.9 - 10.3 mg/dL 7.8  8.7  8.9   Total Protein 6.5 - 8.1 g/dL 5.7  5.3  5.6   Total Bilirubin 0.3 - 1.2 mg/dL 0.4  0.2  0.2   Alkaline Phos 38 - 126 U/L 102  92  79   AST 15 - 41 U/L 32  25  21   ALT 0 - 44 U/L 25  23  20     Gen: A&O X 3 Abd: soft, ND, gravid Ext: trace edema  FHT appropriate for gestational age and FGR  32yo G2P0010 at [redacted]w[redacted]d with super-imposed Pre-E with severe features (BPs), FGR with elevated UAD -s/p BMZ (9/25-26) and magnesium -Labetalol currently at 300mg  TID (was decreased per MFM recommendations that BP <120/80 may results in a low pulse pressure to transfuse the placenta ) -Bps remain normotensive to mild range. -Labs wnl but P:C was 4.26 on admit > will plan to trend twice weekly unless any symptoms or severe range BPs -BID NSTs - I again reviewed with the patient indications for delivery based on non-reassuring fetal status -BPPs  (starting at 32 wga) and UAD 3x weekly - last had them yesterday with elevated UAD, no AEDF. Ordered for Saturday with plans to continue with MWF dopplers next week. -Growth u/s q 2-3 wks -Deliver by 34 weeks; sooner prn neuro symptoms, worsening labs, non-reassuring fetal status, etc. Understands need for magnesium at delivery. - appreciate continued MFM recommendations  Jule Economy, MD

## 2023-03-07 MED ORDER — LABETALOL HCL 200 MG PO TABS
400.0000 mg | ORAL_TABLET | Freq: Three times a day (TID) | ORAL | Status: DC
  Administered 2023-03-07 – 2023-03-11 (×12): 400 mg via ORAL
  Filled 2023-03-07 (×12): qty 2

## 2023-03-07 NOTE — Progress Notes (Signed)
Patient ID: Anona Giovannini, female   DOB: [birth year], 32 y.o.   MRN: [id number] Antepartum Progress Note HD5  Doing well, no complaints. Slept well. No HA, RUQ pain, vision changes. Bps have been normal to low mild range. No contractions, good FM.     03/07/2023    4:09 AM 03/06/2023   11:30 PM 03/06/2023    7:32 PM  Vitals with BMI  Systolic 129 135 782  Diastolic 76 85 89  Pulse 70 75 72      Latest Ref Rng & Units 03/04/2023    4:58 AM 03/03/2023    4:56 PM 02/17/2023    9:03 AM  CBC  WBC 4.0 - 10.5 K/uL 18.8  14.7  12.9   Hemoglobin 12.0 - 15.0 g/dL 95.6  21.3  08.6   Hematocrit 36.0 - 46.0 % 37.7  36.0  35.3   Platelets 150 - 400 K/uL 188  191  156       Latest Ref Rng & Units 03/04/2023    4:58 AM 03/03/2023    4:56 PM 02/17/2023    9:03 AM  CMP  Glucose 70 - 99 mg/dL 578  91  97   BUN 6 - 20 mg/dL 12  17  16    Creatinine 0.44 - 1.00 mg/dL 4.69  6.29  5.28   Sodium 135 - 145 mmol/L 132  136  136   Potassium 3.5 - 5.1 mmol/L 5.0  4.2  4.3   Chloride 98 - 111 mmol/L 102  106  109   CO2 22 - 32 mmol/L 19  22  19    Calcium 8.9 - 10.3 mg/dL 7.8  8.7  8.9   Total Protein 6.5 - 8.1 g/dL 5.7  5.3  5.6   Total Bilirubin 0.3 - 1.2 mg/dL 0.4  0.2  0.2   Alkaline Phos 38 - 126 U/L 102  92  79   AST 15 - 41 U/L 32  25  21   ALT 0 - 44 U/L 25  23  20     Gen: A&O X 3 Abd: soft, ND, gravid Ext: trace edema  FHT min to mod variability, no decels - appropriate for gestational age and FGR  32yo G2P0010 at [redacted]w[redacted]d admitted for cHTN with super-imposed Pre-E with severe features (BPs), FGR with elevated UAD -s/p BMZ (9/25-26) and magnesium -Labetalol currently at 300mg  TID (was decreased per MFM recommendations that BP <120/80 may results in a low pulse pressure to transfuse the placenta ) -Bps remain normotensive to mild range. -Labs wnl but P:C was 4.26 on admit > will plan to trend twice weekly unless any symptoms or severe range BPs -BID NSTs - I again reviewed with the patient  indications for delivery based on non-reassuring fetal status -BPPs (starting at 32 wga) and UAD 3x weekly - last had them Friday 9/27 with elevated UAD, no AEDF. Plan to continue with MWF dopplers next week. -Growth u/s q 2-3 wks - last growth was 9/18 (EFW 799g, 4.8%ile) -Deliver by 34 weeks; sooner prn neuro symptoms, worsening labs, non-reassuring fetal status, etc. Understands need for magnesium at delivery. - appreciate continued MFM recommendations  Jule Economy, MD

## 2023-03-07 NOTE — Progress Notes (Signed)
Brief Update  BPs through today mild range with uptrend - will resume home dose of labetalol 400mg  TID. Plan to check CBC and CMP in the AM.  Jule Economy, MD

## 2023-03-08 LAB — COMPREHENSIVE METABOLIC PANEL
ALT: 21 U/L (ref 0–44)
AST: 23 U/L (ref 15–41)
Albumin: 2.2 g/dL — ABNORMAL LOW (ref 3.5–5.0)
Alkaline Phosphatase: 84 U/L (ref 38–126)
Anion gap: 4 — ABNORMAL LOW (ref 5–15)
BUN: 20 mg/dL (ref 6–20)
CO2: 22 mmol/L (ref 22–32)
Calcium: 8.8 mg/dL — ABNORMAL LOW (ref 8.9–10.3)
Chloride: 107 mmol/L (ref 98–111)
Creatinine, Ser: 0.93 mg/dL (ref 0.44–1.00)
GFR, Estimated: >60 mL/min (ref >60)
Glucose, Bld: 90 mg/dL (ref 70–99)
Potassium: 5.1 mmol/L (ref 3.5–5.1)
Sodium: 133 mmol/L — ABNORMAL LOW (ref 135–145)
Total Bilirubin: 0.4 mg/dL (ref 0.3–1.2)
Total Protein: 4.8 g/dL — ABNORMAL LOW (ref 6.5–8.1)

## 2023-03-08 LAB — CBC
HCT: 34.3 % — ABNORMAL LOW (ref 36.0–46.0)
Hemoglobin: 11.3 g/dL — ABNORMAL LOW (ref 12.0–15.0)
MCH: 31.8 pg (ref 26.0–34.0)
MCHC: 32.9 g/dL (ref 30.0–36.0)
MCV: 96.6 fL (ref 80.0–100.0)
Platelets: 143 10*3/uL — ABNORMAL LOW (ref 150–400)
RBC: 3.55 MIL/uL — ABNORMAL LOW (ref 3.87–5.11)
RDW: 12.1 % (ref 11.5–15.5)
WBC: 15.5 10*3/uL — ABNORMAL HIGH (ref 4.0–10.5)
nRBC: 0 % (ref 0.0–0.2)

## 2023-03-08 MED ORDER — FAMOTIDINE 20 MG PO TABS
20.0000 mg | ORAL_TABLET | Freq: Every day | ORAL | Status: DC
  Administered 2023-03-08: 20 mg via ORAL
  Filled 2023-03-08 (×2): qty 1

## 2023-03-08 NOTE — Progress Notes (Signed)
Brief Progress Note  Notified by RN that patient saw some floaters after lying back in bed. Presented to bedside to evaluate patient. She reports feeling well currently. No HA, vision changes, RUQ pain. Saw a cluster of bright spots after lying back in bed that resolved immediately after. Reviewed slower position changes with the patient. We discussed that overall reassuring that these symptoms are related to position change and not persistent. We also discussed uptrending Bps - most recent 144/92. Reviewed if persistent severe range Bps requiring persistent uptitration of antihypertensives, would consider delivery. Continue labetalol 400mg  TID for now.  All questions answered.  Jule Economy, MD

## 2023-03-08 NOTE — Progress Notes (Signed)
Patient ID: Kanoelani Dobies, female   DOB: [birth year], 32 y.o.   MRN: [id number] Antepartum Progress Note HD6  Doing well, no complaints. Slept well. No HA, RUQ pain, vision changes. Increased labetalol back to home dose of 400mg  TID last night. No contractions, good FM.     03/08/2023    7:48 AM 03/08/2023    4:53 AM 03/07/2023   11:31 PM  Vitals with BMI  Systolic 158 146 413  Diastolic 95 86 77  Pulse 65 66 67      Latest Ref Rng & Units 03/08/2023    4:25 AM 03/04/2023    4:58 AM 03/03/2023    4:56 PM  CBC  WBC 4.0 - 10.5 K/uL 15.5  18.8  14.7   Hemoglobin 12.0 - 15.0 g/dL 24.4  01.0  27.2   Hematocrit 36.0 - 46.0 % 34.3  37.7  36.0   Platelets 150 - 400 K/uL 143  188  191       Latest Ref Rng & Units 03/08/2023    4:25 AM 03/04/2023    4:58 AM 03/03/2023    4:56 PM  CMP  Glucose 70 - 99 mg/dL 90  536  91   BUN 6 - 20 mg/dL 20  12  17    Creatinine 0.44 - 1.00 mg/dL 6.44  0.34  7.42   Sodium 135 - 145 mmol/L 133  132  136   Potassium 3.5 - 5.1 mmol/L 5.1  5.0  4.2   Chloride 98 - 111 mmol/L 107  102  106   CO2 22 - 32 mmol/L 22  19  22    Calcium 8.9 - 10.3 mg/dL 8.8  7.8  8.7   Total Protein 6.5 - 8.1 g/dL 4.8  5.7  5.3   Total Bilirubin 0.3 - 1.2 mg/dL 0.4  0.4  0.2   Alkaline Phos 38 - 126 U/L 84  102  92   AST 15 - 41 U/L 23  32  25   ALT 0 - 44 U/L 21  25  23     Gen: A&O X 3 Abd: soft, ND, gravid Ext: trace edema  FHT min to mod variability, no decels - appropriate for gestational age and FGR  32yo G2P0010 at [redacted]w[redacted]d admitted for cHTN with super-imposed Pre-E with severe features (BPs), FGR with elevated UAD -s/p BMZ (9/25-26) and magnesium -Labetalol currently at 400mg  TID (was decreased previously per MFM recommendations that BP <120/80 may results in a low pulse pressure to transfuse the placenta > increased back to home dose due to uptrending BPs) -Bps remain normotensive to mild range. -Labs wnl but P:C was 4.26 on admit > labs this AM wnl, mild thrombocytopenia.  Will trend again tomorrow AM. -BID NSTs -BPPs (starting at 32 wga) and UAD 3x weekly - last had them Friday 9/27 with elevated UAD, no AEDF. Plan to continue with MWF dopplers next week, dopplers ordered for tomorrow AM. -Growth u/s q 2-3 wks - last growth was 9/18 (EFW 799g, 4.8%ile) -Deliver by 34 weeks; sooner prn neuro symptoms, worsening labs, non-reassuring fetal status, etc. Understands need for magnesium at delivery. - appreciate continued MFM recommendations  Jule Economy, MD

## 2023-03-09 ENCOUNTER — Inpatient Hospital Stay (HOSPITAL_COMMUNITY): Payer: BC Managed Care – PPO

## 2023-03-09 ENCOUNTER — Encounter (HOSPITAL_COMMUNITY)
Admission: AD | Disposition: A | Discharge status: Home / Self Care | Payer: Self-pay | Source: Home / Self Care | Attending: Obstetrics and Gynecology

## 2023-03-09 ENCOUNTER — Other Ambulatory Visit: Payer: Self-pay

## 2023-03-09 ENCOUNTER — Encounter (HOSPITAL_COMMUNITY): Payer: Self-pay | Admitting: Obstetrics and Gynecology

## 2023-03-09 DIAGNOSIS — Z3A28 28 weeks gestation of pregnancy: Secondary | ICD-10-CM

## 2023-03-09 DIAGNOSIS — O321XX0 Maternal care for breech presentation, not applicable or unspecified: Secondary | ICD-10-CM

## 2023-03-09 DIAGNOSIS — O10013 Pre-existing essential hypertension complicating pregnancy, third trimester: Secondary | ICD-10-CM

## 2023-03-09 DIAGNOSIS — O99213 Obesity complicating pregnancy, third trimester: Secondary | ICD-10-CM

## 2023-03-09 DIAGNOSIS — O365930 Maternal care for other known or suspected poor fetal growth, third trimester, not applicable or unspecified: Secondary | ICD-10-CM

## 2023-03-09 DIAGNOSIS — O99891 Other specified diseases and conditions complicating pregnancy: Secondary | ICD-10-CM | POA: Diagnosis not present

## 2023-03-09 DIAGNOSIS — O281 Abnormal biochemical finding on antenatal screening of mother: Secondary | ICD-10-CM | POA: Diagnosis not present

## 2023-03-09 DIAGNOSIS — E669 Obesity, unspecified: Secondary | ICD-10-CM

## 2023-03-09 DIAGNOSIS — O1414 Severe pre-eclampsia complicating childbirth: Secondary | ICD-10-CM

## 2023-03-09 DIAGNOSIS — Q613 Polycystic kidney, unspecified: Secondary | ICD-10-CM

## 2023-03-09 DIAGNOSIS — O1002 Pre-existing essential hypertension complicating childbirth: Secondary | ICD-10-CM

## 2023-03-09 LAB — TYPE AND SCREEN
ABO/RH(D): O NEG
Antibody Screen: NEGATIVE

## 2023-03-09 LAB — CBC
HCT: 34.3 % — ABNORMAL LOW (ref 36.0–46.0)
Hemoglobin: 11.9 g/dL — ABNORMAL LOW (ref 12.0–15.0)
MCH: 33.4 pg (ref 26.0–34.0)
MCHC: 34.7 g/dL (ref 30.0–36.0)
MCV: 96.3 fL (ref 80.0–100.0)
Platelets: 145 10*3/uL — ABNORMAL LOW (ref 150–400)
RBC: 3.56 MIL/uL — ABNORMAL LOW (ref 3.87–5.11)
RDW: 12.1 % (ref 11.5–15.5)
WBC: 14.9 10*3/uL — ABNORMAL HIGH (ref 4.0–10.5)
nRBC: 0 % (ref 0.0–0.2)

## 2023-03-09 LAB — COMPREHENSIVE METABOLIC PANEL
ALT: 18 U/L (ref 0–44)
AST: 19 U/L (ref 15–41)
Albumin: 2.2 g/dL — ABNORMAL LOW (ref 3.5–5.0)
Alkaline Phosphatase: 84 U/L (ref 38–126)
Anion gap: 10 (ref 5–15)
BUN: 18 mg/dL (ref 6–20)
CO2: 20 mmol/L — ABNORMAL LOW (ref 22–32)
Calcium: 8.8 mg/dL — ABNORMAL LOW (ref 8.9–10.3)
Chloride: 106 mmol/L (ref 98–111)
Creatinine, Ser: 0.84 mg/dL (ref 0.44–1.00)
GFR, Estimated: >60 mL/min (ref >60)
Glucose, Bld: 79 mg/dL (ref 70–99)
Potassium: 4.9 mmol/L (ref 3.5–5.1)
Sodium: 136 mmol/L (ref 135–145)
Total Bilirubin: 0.2 mg/dL — ABNORMAL LOW (ref 0.3–1.2)
Total Protein: 4.8 g/dL — ABNORMAL LOW (ref 6.5–8.1)

## 2023-03-09 SURGERY — Surgical Case
Anesthesia: Spinal

## 2023-03-09 MED ORDER — CEFAZOLIN SODIUM-DEXTROSE 2-4 GM/100ML-% IV SOLN
2.0000 g | INTRAVENOUS | Status: AC
  Administered 2023-03-09: 2 g via INTRAVENOUS
  Filled 2023-03-09: qty 100

## 2023-03-09 MED ORDER — ACETAMINOPHEN 500 MG PO TABS
1000.0000 mg | ORAL_TABLET | Freq: Four times a day (QID) | ORAL | Status: AC
  Administered 2023-03-09 – 2023-03-10 (×3): 1000 mg via ORAL
  Filled 2023-03-09 (×3): qty 2

## 2023-03-09 MED ORDER — ACETAMINOPHEN 325 MG PO TABS
650.0000 mg | ORAL_TABLET | ORAL | Status: DC | PRN
  Administered 2023-03-10 – 2023-03-12 (×6): 650 mg via ORAL
  Filled 2023-03-09 (×7): qty 2

## 2023-03-09 MED ORDER — ONDANSETRON HCL 4 MG/2ML IJ SOLN
INTRAMUSCULAR | Status: AC
  Filled 2023-03-09: qty 2

## 2023-03-09 MED ORDER — CYCLOBENZAPRINE HCL 10 MG PO TABS: 5.0000 mg | ORAL_TABLET | Freq: Two times a day (BID) | ORAL | Status: DC | PRN

## 2023-03-09 MED ORDER — WITCH HAZEL-GLYCERIN EX PADS: 1.0000 | MEDICATED_PAD | CUTANEOUS | Status: DC | PRN

## 2023-03-09 MED ORDER — OXYTOCIN-SODIUM CHLORIDE 30-0.9 UT/500ML-% IV SOLN: 2.5000 [IU]/h | INTRAVENOUS | Status: AC

## 2023-03-09 MED ORDER — LACTATED RINGERS IV SOLN: INTRAVENOUS | Status: DC

## 2023-03-09 MED ORDER — BUPIVACAINE IN DEXTROSE 0.75-8.25 % IT SOLN
INTRATHECAL | Status: DC | PRN
  Administered 2023-03-09: 1.6 mL via INTRATHECAL

## 2023-03-09 MED ORDER — OXYTOCIN-SODIUM CHLORIDE 30-0.9 UT/500ML-% IV SOLN
INTRAVENOUS | Status: AC
  Filled 2023-03-09: qty 500

## 2023-03-09 MED ORDER — NALOXONE HCL 4 MG/10ML IJ SOLN: 1.0000 ug/kg/h | INTRAVENOUS | Status: DC | PRN

## 2023-03-09 MED ORDER — DIPHENHYDRAMINE HCL 25 MG PO CAPS: 25.0000 mg | ORAL_CAPSULE | Freq: Four times a day (QID) | ORAL | Status: DC | PRN

## 2023-03-09 MED ORDER — OXYCODONE HCL 5 MG PO TABS
5.0000 mg | ORAL_TABLET | ORAL | Status: DC | PRN
  Administered 2023-03-10: 5 mg via ORAL
  Filled 2023-03-09: qty 1

## 2023-03-09 MED ORDER — SCOPOLAMINE 1 MG/3DAYS TD PT72
1.0000 | MEDICATED_PATCH | Freq: Once | TRANSDERMAL | Status: AC
  Administered 2023-03-09: 1.5 mg via TRANSDERMAL

## 2023-03-09 MED ORDER — FENTANYL CITRATE (PF) 100 MCG/2ML IJ SOLN
INTRAMUSCULAR | Status: AC
  Filled 2023-03-09: qty 2

## 2023-03-09 MED ORDER — MORPHINE SULFATE (PF) 0.5 MG/ML IJ SOLN
INTRAMUSCULAR | Status: AC
  Filled 2023-03-09: qty 10

## 2023-03-09 MED ORDER — STERILE WATER FOR IRRIGATION IR SOLN
Status: DC | PRN
  Administered 2023-03-09 (×2): 1

## 2023-03-09 MED ORDER — HYDRALAZINE HCL 20 MG/ML IJ SOLN: 10.0000 mg | INTRAMUSCULAR | Status: DC | PRN

## 2023-03-09 MED ORDER — PHENYLEPHRINE HCL (PRESSORS) 10 MG/ML IV SOLN
INTRAVENOUS | Status: DC | PRN
  Administered 2023-03-09: 80 ug via INTRAVENOUS

## 2023-03-09 MED ORDER — DEXAMETHASONE SODIUM PHOSPHATE 10 MG/ML IJ SOLN
INTRAMUSCULAR | Status: DC | PRN
  Administered 2023-03-09: 10 mg via INTRAVENOUS

## 2023-03-09 MED ORDER — MAGNESIUM SULFATE BOLUS VIA INFUSION
2.0000 g | Freq: Once | INTRAVENOUS | Status: AC
  Administered 2023-03-09: 2 g via INTRAVENOUS
  Filled 2023-03-09: qty 1000

## 2023-03-09 MED ORDER — LABETALOL HCL 5 MG/ML IV SOLN: 40.0000 mg | INTRAVENOUS | Status: DC | PRN

## 2023-03-09 MED ORDER — SOD CITRATE-CITRIC ACID 500-334 MG/5ML PO SOLN
30.0000 mL | ORAL | Status: AC
  Administered 2023-03-09: 30 mL via ORAL
  Filled 2023-03-09: qty 30

## 2023-03-09 MED ORDER — MORPHINE SULFATE (PF) 0.5 MG/ML IJ SOLN
INTRAMUSCULAR | Status: DC | PRN
  Administered 2023-03-09: 150 ug via INTRATHECAL

## 2023-03-09 MED ORDER — FENTANYL CITRATE (PF) 100 MCG/2ML IJ SOLN
50.0000 ug | Freq: Once | INTRAMUSCULAR | Status: AC
  Administered 2023-03-09: 50 ug via INTRAVENOUS

## 2023-03-09 MED ORDER — SODIUM CHLORIDE 0.9 % IV SOLN: 500.0000 mg | INTRAVENOUS | Status: DC

## 2023-03-09 MED ORDER — ONDANSETRON HCL 4 MG/2ML IJ SOLN
INTRAMUSCULAR | Status: DC | PRN
  Administered 2023-03-09: 4 mg via INTRAVENOUS

## 2023-03-09 MED ORDER — ACETAMINOPHEN 10 MG/ML IV SOLN
INTRAVENOUS | Status: AC
  Filled 2023-03-09: qty 100

## 2023-03-09 MED ORDER — LABETALOL HCL 5 MG/ML IV SOLN: 20.0000 mg | INTRAVENOUS | Status: DC | PRN

## 2023-03-09 MED ORDER — ZOLPIDEM TARTRATE 5 MG PO TABS
5.0000 mg | ORAL_TABLET | Freq: Every evening | ORAL | Status: DC | PRN
  Administered 2023-03-09 – 2023-03-12 (×2): 5 mg via ORAL
  Filled 2023-03-09 (×2): qty 1

## 2023-03-09 MED ORDER — LABETALOL HCL 5 MG/ML IV SOLN: 80.0000 mg | INTRAVENOUS | Status: DC | PRN

## 2023-03-09 MED ORDER — SODIUM CHLORIDE 0.9% FLUSH: 3.0000 mL | INTRAVENOUS | Status: DC | PRN

## 2023-03-09 MED ORDER — COCONUT OIL OIL: 1.0000 | TOPICAL_OIL | Status: DC | PRN

## 2023-03-09 MED ORDER — DIPHENHYDRAMINE HCL 25 MG PO CAPS: 25.0000 mg | ORAL_CAPSULE | ORAL | Status: DC | PRN

## 2023-03-09 MED ORDER — MEPERIDINE HCL 25 MG/ML IJ SOLN: 6.2500 mg | INTRAMUSCULAR | Status: DC | PRN

## 2023-03-09 MED ORDER — FENTANYL CITRATE (PF) 100 MCG/2ML IJ SOLN
INTRAMUSCULAR | Status: DC | PRN
  Administered 2023-03-09: 15 ug via INTRATHECAL

## 2023-03-09 MED ORDER — PRENATAL MULTIVITAMIN CH
1.0000 | ORAL_TABLET | Freq: Every day | ORAL | Status: DC
  Filled 2023-03-09 (×2): qty 1

## 2023-03-09 MED ORDER — NALOXONE HCL 0.4 MG/ML IJ SOLN: 0.4000 mg | INTRAMUSCULAR | Status: DC | PRN

## 2023-03-09 MED ORDER — DIBUCAINE (PERIANAL) 1 % EX OINT: 1.0000 | TOPICAL_OINTMENT | CUTANEOUS | Status: DC | PRN

## 2023-03-09 MED ORDER — DEXAMETHASONE SODIUM PHOSPHATE 10 MG/ML IJ SOLN
INTRAMUSCULAR | Status: AC
  Filled 2023-03-09: qty 1

## 2023-03-09 MED ORDER — DIPHENHYDRAMINE HCL 50 MG/ML IJ SOLN: 12.5000 mg | INTRAMUSCULAR | Status: DC | PRN

## 2023-03-09 MED ORDER — MENTHOL 3 MG MT LOZG: 1.0000 | LOZENGE | OROMUCOSAL | Status: DC | PRN

## 2023-03-09 MED ORDER — SIMETHICONE 80 MG PO CHEW: 80.0000 mg | CHEWABLE_TABLET | ORAL | Status: DC | PRN

## 2023-03-09 MED ORDER — PHENYLEPHRINE HCL-NACL 20-0.9 MG/250ML-% IV SOLN
INTRAVENOUS | Status: DC | PRN
  Administered 2023-03-09: 60 ug/min via INTRAVENOUS

## 2023-03-09 MED ORDER — ONDANSETRON HCL 4 MG/2ML IJ SOLN: 4.0000 mg | Freq: Three times a day (TID) | INTRAMUSCULAR | Status: DC | PRN

## 2023-03-09 MED ORDER — SENNOSIDES-DOCUSATE SODIUM 8.6-50 MG PO TABS
2.0000 | ORAL_TABLET | Freq: Every day | ORAL | Status: DC
  Administered 2023-03-11: 2 via ORAL
  Filled 2023-03-09 (×3): qty 2

## 2023-03-09 MED ORDER — OXYTOCIN-SODIUM CHLORIDE 30-0.9 UT/500ML-% IV SOLN
INTRAVENOUS | Status: DC | PRN
  Administered 2023-03-09: 300 mL via INTRAVENOUS

## 2023-03-09 MED ORDER — ACETAMINOPHEN 10 MG/ML IV SOLN
INTRAVENOUS | Status: DC | PRN
  Administered 2023-03-09: 1000 mg via INTRAVENOUS

## 2023-03-09 MED ORDER — SIMETHICONE 80 MG PO CHEW
80.0000 mg | CHEWABLE_TABLET | Freq: Three times a day (TID) | ORAL | Status: DC
  Administered 2023-03-10 – 2023-03-12 (×7): 80 mg via ORAL
  Filled 2023-03-09 (×7): qty 1

## 2023-03-09 MED ORDER — SCOPOLAMINE 1 MG/3DAYS TD PT72
MEDICATED_PATCH | TRANSDERMAL | Status: AC
  Filled 2023-03-09: qty 1

## 2023-03-09 MED ORDER — MAGNESIUM SULFATE 40 GM/1000ML IV SOLN
1.0000 g/h | INTRAVENOUS | Status: AC
  Filled 2023-03-09: qty 1000

## 2023-03-09 SURGICAL SUPPLY — 33 items
APL PRP STRL LF DISP 70% ISPRP (MISCELLANEOUS) ×2
APL SKNCLS STERI-STRIP NONHPOA (GAUZE/BANDAGES/DRESSINGS) ×1
BENZOIN TINCTURE PRP APPL 2/3 (GAUZE/BANDAGES/DRESSINGS) IMPLANT
CHLORAPREP W/TINT 26 (MISCELLANEOUS) ×2 IMPLANT
CLAMP UMBILICAL CORD (MISCELLANEOUS) ×1 IMPLANT
CLIP FILSHIE TUBAL LIGA STRL (Clip) IMPLANT
CLOTH BEACON ORANGE TIMEOUT ST (SAFETY) ×1 IMPLANT
DRSG OPSITE POSTOP 4X10 (GAUZE/BANDAGES/DRESSINGS) ×1 IMPLANT
ELECT REM PT RETURN 9FT ADLT (ELECTROSURGICAL) ×1
ELECTRODE REM PT RTRN 9FT ADLT (ELECTROSURGICAL) ×1 IMPLANT
EXTRACTOR VACUUM M CUP 4 TUBE (SUCTIONS) IMPLANT
GLOVE BIOGEL PI IND STRL 7.0 (GLOVE) ×2 IMPLANT
GLOVE BIOGEL PI IND STRL 7.5 (GLOVE) ×1 IMPLANT
GLOVE ECLIPSE 7.0 STRL STRAW (GLOVE) ×1 IMPLANT
GOWN STRL REUS W/TWL LRG LVL3 (GOWN DISPOSABLE) ×2 IMPLANT
HEMOSTAT ARISTA ABSORB 3G PWDR (HEMOSTASIS) IMPLANT
KIT ABG SYR 3ML LUER SLIP (SYRINGE) IMPLANT
NDL HYPO 25X5/8 SAFETYGLIDE (NEEDLE) IMPLANT
NEEDLE HYPO 25X5/8 SAFETYGLIDE (NEEDLE) IMPLANT
NS IRRIG 1000ML POUR BTL (IV SOLUTION) ×1 IMPLANT
PACK C SECTION WH (CUSTOM PROCEDURE TRAY) ×1 IMPLANT
PAD OB MATERNITY 4.3X12.25 (PERSONAL CARE ITEMS) ×1 IMPLANT
STRIP CLOSURE SKIN 1/2X4 (GAUZE/BANDAGES/DRESSINGS) IMPLANT
SUT PDS AB 0 CTX 60 (SUTURE) ×2 IMPLANT
SUT PLAIN 0 NONE (SUTURE) IMPLANT
SUT VIC AB 0 CT1 36 (SUTURE) IMPLANT
SUT VIC AB 0 CTX 36 (SUTURE) ×3
SUT VIC AB 0 CTX36XBRD ANBCTRL (SUTURE) ×3 IMPLANT
SUT VIC AB 4-0 KS 27 (SUTURE) ×1 IMPLANT
SUTURE PLAIN GUT 2.0 ETHICON (SUTURE) IMPLANT
TOWEL OR 17X24 6PK STRL BLUE (TOWEL DISPOSABLE) ×1 IMPLANT
TRAY FOLEY W/BAG SLVR 14FR LF (SET/KITS/TRAYS/PACK) ×1 IMPLANT
WATER STERILE IRR 1000ML POUR (IV SOLUTION) ×1 IMPLANT

## 2023-03-09 NOTE — Progress Notes (Signed)
Antepartum Progress Note   Rebecca [name] is a 32 y.o. female G2P0010 at [redacted]w[redacted]d that is admitted to [facility] for cHTN with SI preeclampsia in the setting of PCKD, fetal growth restriction with elevated UAD.  Overnight, she reports no acute events.  Admits fetal movement, denies contractions, denies leakage of fluid, denies vaginal bleeding.  She denies headache, vision changes, RUQ or epigastric pain. She has some left sided neck pain from her sleeping position.   History   Blood pressure (!) 148/85, pulse 63, temperature 98.3 F (36.8 C), temperature source Oral, resp. rate 18, height 5' (1.524 m), weight 88.9 kg, last menstrual period 08/18/2022, SpO2 100%, unknown if currently breastfeeding. Exam  Physical Exam: Gen: Alert, well appearing, no distress Chest: nonlabored breathing CV: no peripheral edema Abdomen: gravid, soft and nontender Ext: no evidence of DVT  FHT: Minimal to moderate variability. Occasional variable decels. Appropriate for gestational age and growth restriction.   Assessment/Plan: Admitted to [facility] for chronic hypertension with superimposed preeclampsia with severe feature, fetal growth restriction and elevated UAD. S/p BMZ 9/25-26, s/p NP magnesium Labetalol current 400 mg TID.  BP currently mild range. Will try and maintain BP > 120/80 per MFM recs for placental perfusion. HELLP labs with mild decreased platelets to 143, Stable at 145 today.  EFM and toco - BID NSTs UAD on MWF, ordered for today Repeat growth q 2-3 weeks, most recent EFW on 9/18 (799g, 4.8%ile) Deliver by 34 weeks; sooner prn neuro symptoms, worsening labs, non-reassuring fetal status, etc. Understands need for magnesium at delivery. appreciate continued MFM recommendations DVT Ppx: SCDs Dispo: Inpatient monitoring for progression of disease.    Lyn Henri 03/09/2023, 7:33 AM

## 2023-03-09 NOTE — Transfer of Care (Signed)
Immediate Anesthesia Transfer of Care Note  Patient: Rebecca [name]  Procedure(s) Performed: CESAREAN SECTION  Patient Location: PACU  Anesthesia Type:Spinal  Level of Consciousness: awake, alert , and oriented  Airway & Oxygen Therapy: Patient Spontanous Breathing  Post-op Assessment: Report given to RN and Post -op Vital signs reviewed and stable  Post vital signs: Reviewed and stable  Last Vitals:  Vitals Value Taken Time  BP 127/86 03/09/23 1452  Temp    Pulse 67 03/09/23 1457  Resp 10 03/09/23 1457  SpO2 99 % 03/09/23 1457  Vitals shown include unfiled device data.  Last Pain:  Vitals:   03/09/23 1249  TempSrc: Oral  PainSc:          Complications: No notable events documented.

## 2023-03-09 NOTE — Progress Notes (Signed)
At bedside to discuss plan of care.  Monitoring today has been with minimal to moderate variability, however more frequent variable decels and late decels.  Discussed this interval change and concern for fetal distress.  Blood pressure has also progressed with last BP 184/94 > 173/98.  Procardia protocol initiated.  Discussed these findings with MFM on call Dr Parke Poisson and agrees with plan to proceed with delivery.  OR alerted and will proceed as soon as able.   Patient consented.  Rebecca [name]

## 2023-03-09 NOTE — Op Note (Addendum)
Operative Note  PROCEDURE DATE: 03/09/23   PREOPERATIVE DIAGNOSIS: PCKD, cHTN, Preeclampsia with severe range BP, nonreassuring FHT, IUGR with elevated dopplers.Frank breech.   POSTOPERATIVE DIAGNOSIS: The same   PROCEDURE:    Primary Low Transverse Cesarean Section   SURGEON:  Dr. Nilda Simmer Assistant: Dr. Sundra Aland  An experienced assistant was required given the standard of surgical care given the complexity of the case.  This assistant was needed for exposure, dissection, suctioning, retraction, instrument exchange, assisting with delivery with administration of fundal pressure, and for overall help during the procedure.     INDICATIONS: This is a 32 y.o. yo G2P0 at [redacted]w[redacted]d requiring cesarean section secondary to nonreassuring fetal heart tracing and elevated blood pressures. She was admitted to antepartum for management of cHTN with superimposed preeclampsia with severe features. This is in the setting of PCKD and IUGR with elevated dopplers.  On her AM monitoring, nonreassuring fetal heart tracing was noted, including more frequent variable decelerations, later decelerations, and minimal variability.   At this time, blood pressure also increased to the severe range, requiring treatment.  The case was discussed with MFM and were in agreement to move towards prompt delivery.  Decision made to proceed with LTCS given FHT and breech presentation. The risks of cesarean section discussed with the patient included but were not limited to: bleeding which may require transfusion or reoperation; infection which may require antibiotics; injury to bowel, bladder, ureters or other surrounding organs; injury to the fetus; need for additional procedures including hysterectomy in the event of a life-threatening hemorrhage; placental abnormalities wth subsequent pregnancies, incisional problems, thromboembolic phenomenon and other postoperative/anesthesia complications. The patient agreed with the  proposed plan, giving informed consent for the procedure.     FINDINGS:  Viable female infant in frank breech presentation, APGARs per NICU,  Weight pending, Amniotic fluid clear,  Intact small placenta, three vessel cord.  Grossly normal uterus. Cord blood attempted but very little flow. .   ANESTHESIA:    Epidural ESTIMATED BLOOD LOSS: pend SPECIMENS: Placenta for path (IUGR, preeclampsia COMPLICATIONS: None immediate    PROCEDURE IN DETAIL:  The patient received intravenous antibiotics (2g Ancef) and had sequential compression devices applied to her lower extremities while in the preoperative area.  She was then taken to the operating room where epidural anesthesia was dosed up to surgical level and was found to be adequate. She was then placed in a dorsal supine position with a leftward tilt, and prepped and draped in a sterile manner.  A foley catheter was placed into her bladder and attached to constant gravity.  After an adequate timeout was performed, a Pfannenstiel skin incision was made with scalpel and carried through to the underlying layer of fascia. The fascia was incised in the midline and this incision was extended bilaterally using the Mayo scissors. Kocher clamps were applied to the superior aspect of the fascial incision and the underlying rectus muscles were dissected off bluntly. A similar process was carried out on the inferior aspect of the facial incision. The rectus muscles were separated in the midline bluntly and the peritoneum was entered bluntly.  A bladder flap was created sharply and developed bluntly. The lower uterine segment seemed developed enough for a transverse uterine incision. A transverse hysterotomy was made with a scalpel and extended bilaterally bluntly. The bladder blade was then removed. The infant was successfully delivered via breech maneuvers, and cord was clamped and cut and infant was handed over to awaiting neonatology team. Uterine massage  was then  administered and the placenta delivered intact with three-vessel cord. Cord gases. The uterus was cleared of clot and debris.  The hysterotomy was closed with 0 vicryl.  A second imbricating suture of 0-vicryl was used to reinforce the incision and aid in hemostasis.The fascia was closed with 0-Vicryl in a running fashion with good restoration of anatomy.  The subcutaneus tissue was irrigated and was reapproximated using running plain gut.  The skin was closed with 4-0 Vicryl in a subcuticular fashion.  All surgical site and was hemostatic at end of procedure without any further bleeding on exam.    Pt tolerated the procedure well. All sponge/lap/needle counts were correct  X 2. Pt taken to recovery room in stable condition.     Nilda Simmer MD

## 2023-03-09 NOTE — Anesthesia Preprocedure Evaluation (Addendum)
Anesthesia Evaluation  Patient identified by MRN, date of birth, ID band Patient awake    Reviewed: Allergy & Precautions, H&P , NPO status , Patient's Chart, lab work & pertinent test results  Airway Mallampati: II  TM Distance: >3 FB Neck ROM: Full    Dental no notable dental hx.    Pulmonary asthma    Pulmonary exam normal breath sounds clear to auscultation       Cardiovascular hypertension, Normal cardiovascular exam Rhythm:Regular Rate:Normal     Neuro/Psych  Headaches PSYCHIATRIC DISORDERS Anxiety        GI/Hepatic negative GI ROS, Neg liver ROS,,,  Endo/Other  negative endocrine ROS    Renal/GU Renal diseasePCKD  negative genitourinary   Musculoskeletal negative musculoskeletal ROS (+)    Abdominal   Peds negative pediatric ROS (+)  Hematology negative hematology ROS (+) Plt 145   Anesthesia Other Findings Pre-E with SF  Reproductive/Obstetrics (+) Pregnancy                             Anesthesia Physical Anesthesia Plan  ASA: 3  Anesthesia Plan: Spinal   Post-op Pain Management:    Induction:   PONV Risk Score and Plan: Treatment may vary due to age or medical condition  Airway Management Planned: Natural Airway  Additional Equipment:   Intra-op Plan:   Post-operative Plan:   Informed Consent: I have reviewed the patients History and Physical, chart, labs and discussed the procedure including the risks, benefits and alternatives for the proposed anesthesia with the patient or authorized representative who has indicated his/her understanding and acceptance.     Dental advisory given  Plan Discussed with: CRNA  Anesthesia Plan Comments: Peachey Fadness is a 32 y.o. female G2P0010 at [redacted]w[redacted]d that is admitted to [facility] for cHTN with SI preeclampsia in the setting of PCKD, fetal growth restriction with elevated UAD)        Anesthesia Quick  Evaluation

## 2023-03-09 NOTE — Anesthesia Procedure Notes (Addendum)
Spinal  Patient location during procedure: OR Start time: 03/09/2023 1:15 PM End time: 03/09/2023 1:20 PM Reason for block: surgical anesthesia Staffing Performed: anesthesiologist  Anesthesiologist: Armington Nation, MD Performed by: Abanda Nation, MD Authorized by: Giltner Nation, MD   Preanesthetic Checklist Completed: patient identified, IV checked, site marked, risks and benefits discussed, surgical consent, monitors and equipment checked, pre-op evaluation and timeout performed Spinal Block Patient position: sitting Prep: DuraPrep Patient monitoring: heart rate, cardiac monitor, continuous pulse ox and blood pressure Approach: midline Location: L4-5 Needle Needle type: Pencan  Needle gauge: 24 G Assessment Sensory level: T6 Additional Notes Functioning IV was confirmed and monitors were applied. Sterile prep and drape, including hand hygiene and sterile gloves were used. The patient was positioned and the spine was prepped. The skin was anesthetized with lidocaine.  Free flow of clear CSF was obtained prior to injecting local anesthetic into the CSF.  The spinal needle aspirated freely following injection.  The needle was carefully withdrawn.  The patient tolerated the procedure well.

## 2023-03-09 NOTE — Anesthesia Postprocedure Evaluation (Signed)
Anesthesia Post Note  Patient: Rebecca [name]  Procedure(s) Performed: CESAREAN SECTION     Patient location during evaluation: PACU Anesthesia Type: Spinal Level of consciousness: oriented and awake and alert Pain management: pain level controlled Vital Signs Assessment: post-procedure vital signs reviewed and stable Respiratory status: spontaneous breathing, respiratory function stable and patient connected to nasal cannula oxygen Cardiovascular status: blood pressure returned to baseline and stable Postop Assessment: no headache, no backache and no apparent nausea or vomiting Anesthetic complications: no   No notable events documented.  Last Vitals:  Vitals:   03/09/23 1644 03/09/23 1652  BP: (!) 141/82 (!) 141/79  Pulse: 67 69  Resp: 16   Temp:  36.8 C  SpO2: 98% 98%    Last Pain:  Vitals:   03/09/23 1652  TempSrc: Oral  PainSc:    Pain Goal:                   [place] Nation

## 2023-03-10 LAB — CBC
HCT: 35.5 % — ABNORMAL LOW (ref 36.0–46.0)
Hemoglobin: 12.2 g/dL (ref 12.0–15.0)
MCH: 33 pg (ref 26.0–34.0)
MCHC: 34.4 g/dL (ref 30.0–36.0)
MCV: 95.9 fL (ref 80.0–100.0)
Platelets: 192 10*3/uL (ref 150–400)
RBC: 3.7 MIL/uL — ABNORMAL LOW (ref 3.87–5.11)
RDW: 12.1 % (ref 11.5–15.5)
WBC: 23.2 10*3/uL — ABNORMAL HIGH (ref 4.0–10.5)
nRBC: 0 % (ref 0.0–0.2)

## 2023-03-10 LAB — COMPREHENSIVE METABOLIC PANEL
ALT: 17 U/L (ref 0–44)
AST: 23 U/L (ref 15–41)
Albumin: 2.3 g/dL — ABNORMAL LOW (ref 3.5–5.0)
Alkaline Phosphatase: 84 U/L (ref 38–126)
Anion gap: 8 (ref 5–15)
BUN: 14 mg/dL (ref 6–20)
CO2: 23 mmol/L (ref 22–32)
Calcium: 7.9 mg/dL — ABNORMAL LOW (ref 8.9–10.3)
Chloride: 103 mmol/L (ref 98–111)
Creatinine, Ser: 1 mg/dL (ref 0.44–1.00)
GFR, Estimated: >60 mL/min (ref >60)
Glucose, Bld: 106 mg/dL — ABNORMAL HIGH (ref 70–99)
Potassium: 4.6 mmol/L (ref 3.5–5.1)
Sodium: 134 mmol/L — ABNORMAL LOW (ref 135–145)
Total Bilirubin: 0.3 mg/dL (ref 0.3–1.2)
Total Protein: 5.2 g/dL — ABNORMAL LOW (ref 6.5–8.1)

## 2023-03-10 MED ORDER — RHO D IMMUNE GLOBULIN 1500 UNIT/2ML IJ SOSY
300.0000 ug | PREFILLED_SYRINGE | Freq: Once | INTRAMUSCULAR | Status: AC
  Administered 2023-03-10: 300 ug via INTRAVENOUS
  Filled 2023-03-10: qty 2

## 2023-03-10 NOTE — Progress Notes (Signed)
Post Partum Day 1 s/p pLTCS for nrFHT at 2 wga, baby girl in NICU  Subjective: no complaints, up ad lib, voiding, and tolerating PO  Objective: Blood pressure 129/83, pulse (!) 57, temperature 97.8 F (36.6 C), temperature source Oral, resp. rate 18, height 5' (1.524 m), weight 88.9 kg, last menstrual period 08/18/2022, SpO2 99%, unknown if currently breastfeeding.  Physical Exam:  General: alert Lochia: appropriate Uterine Fundus: firm Incision: healing well DVT Evaluation: No evidence of DVT seen on physical exam.  Recent Labs    03/09/23 0726 03/10/23 0610  HGB 11.9* 12.2  HCT 34.3* 35.5*    Assessment/Plan: Lactation consult POD#1 s/p CS SIPE/nrFHT Continue Mag until 24 hours pp Continue Labetalol 400mg  TID Support given.Baby girl in NICU    LOS: 7 days   Ranae Pila, MD 03/10/2023, 9:58 AM

## 2023-03-10 NOTE — Lactation Note (Signed)
This note was copied from a baby's chart.  NICU Lactation Consultation Note  Patient Name: Rebecca [name] [id number] Date: 03/10/2023 Age:31 hours  Reason for consult: NICU baby; Primapara; 1st time breastfeeding; Infant < 6lbs; Other (Comment); Preterm <34wks (cHTN)  SUBJECTIVE Visited with family of 62 hours old pre-term NICU female; Rebecca [name] is a P1 and reports she's been pumping consistently and already getting enough colostrum to collect, praised her for her efforts. Assisted with hand expression and breast massage, her plan is to exclusively pump and bottle feed; but she's aware that [facility] services are available in case she wants to latch baby on at some point. Provided a hands-free pumping top in size "L" and a pumping diary. Reviewed pumping schedule, pump settings, lactogenesis II and anticipatory guidelines.  OBJECTIVE Infant data: Mother's Current Feeding Choice: -- (NPO)  O2 Device: Jet Vent FiO2 (%): 30 %  Infant feeding assessment No data recorded  Maternal data: G2P0010 C-Section, Low Transverse Has patient been taught Hand Expression?: Yes Significant Breast History:: (++) breast changes during the pregnancy Current breast feeding challenges:: NICU admission Does the patient have breastfeeding experience prior to this delivery?: No Pumping frequency: initiated pumping at 5 hours post-partum Pumped volume: 1 mL Flange Size: 21 Hands-free pumping top sizes: Large Rebecca [name]) Risk factor for low/delayed milk supply:: primipara, prematurity, infant separation, C/S, Pre-Eclampsia, Mag  Pump: Personal, Hands Free (Spectra S1, Mom Cozy)  ASSESSMENT Infant: Feeding Status: NPO  Maternal: Milk volume: Normal  INTERVENTIONS/PLAN Interventions: Interventions: Breast feeding basics reviewed; Breast massage; Hand express; Coconut oil; DEBP; Education; [employer] brochure; NICU Pumping Log Tools: Pump; Flanges; Coconut oil; Hands-free pumping top Pump Education: Setup,  frequency, and cleaning; Milk Storage  Plan: Encouraged pumping every 3 hours, ideally 8 pumping sessions/24 hours Breast massage, hand expression and coconut oil were also encouraged prior pumping  Rebecca [name] present and supportive. All questions and concerns answered, family to contact [facility] services PRN.  Consult Status: NICU follow-up NICU Follow-up type: New admission follow up; Maternal D/C visit   Rebecca [name] 03/10/2023, 6:07 PM

## 2023-03-11 LAB — RH IG WORKUP (INCLUDES ABO/RH)
Fetal Screen: NEGATIVE
Gestational Age(Wks): 28
Unit division: 0

## 2023-03-11 LAB — SURGICAL PATHOLOGY

## 2023-03-11 NOTE — Progress Notes (Signed)
Pt declined MMR and T dap vaccine at this time. Pt. Educated and VIS given.

## 2023-03-11 NOTE — Clinical Social Work Maternal (Signed)
CLINICAL SOCIAL WORK MATERNAL/CHILD NOTE  Patient Details  Name: Rebecca [name] MRN: [id number] Date of Birth: 01/26/1991  Date:  03/11/2023  Clinical Social Worker Initiating Note:  Celso Sickle, [place] Date/Time: Initiated:  03/11/23/1214     Child's Name:  Rebecca [name]   Biological Parents:  Mother, Father (Father: Anakaren Campion)   Need for Interpreter:  None   Reason for Referral:  Parental Support of Premature Babies < 32 weeks/or Critically Ill babies   Address:  [address] [place] [address]    Phone number:  [phone] (home)     Additional phone number:   Household Members/Support Persons (HM/SP):   Household Member/Support Person 1   HM/SP Name Relationship DOB or Age  HM/SP -1 Jaleen Grupp FOB/Husband    HM/SP -2        HM/SP -3        HM/SP -4        HM/SP -5        HM/SP -6        HM/SP -7        HM/SP -8          Natural Supports (not living in the home):      Professional Supports: None   Employment: Full-time   Type of Work: Tour manager   Education:  Engineer, maintenance (IT)   Homebound arranged:    Surveyor, quantity Resources:  Media planner    Other Resources:      Cultural/Religious Considerations Which May Impact Care:    Strengths:  Ability to meet basic needs  , Understanding of illness   Psychotropic Medications:         Pediatrician:       Pediatrician List:   Radiographer, therapeutic    [place]    [facility]      Pediatrician Fax Number:    Risk Factors/Current Problems:  None   Cognitive State:  Able to Concentrate  , Alert  , Goal Oriented  , Linear Thinking     Mood/Affect:  Calm  , Comfortable  , Euthymic  , Interested     CSW Assessment: CSW met with MOB at bedside to complete psychosocial assessment. MOB was accompanied by FOB and two additional guests. CSW introduced self and explained role. MOB granted CSW verbal permission to speak in front of  everyone about anything. MOB was welcoming, pleasant, and remained engaged during assessment. 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 Parents reported that they have started to shop for infant and will have all needed items to care for infant prior to discharge.   CSW inquired about MOB's mental health history. MOB denied any mental health history. CSW inquired about how MOB was feeling emotionally since giving birth, MOB reported that she was feeling fine. MOB presented calm and did not demonstrate any acute mental health signs/symptoms. CSW assessed for safety, MOB denied SI and HI. CSW did not assess for domestic violence as MOB was not alone.   CSW provided education regarding the baby blues period vs. perinatal mood disorders, discussed treatment and gave resources for mental health follow up if concerns arise.  CSW recommends self-evaluation during the postpartum time period using the New Mom Checklist from Postpartum Progress and encouraged MOB to contact a medical professional if symptoms are noted at any time.    CSW and  parents discussed infant's NICU admission. CSW informed parents about the NICU, what to expect, and resources/supports available while infant is admitted to the NICU. Parents reported that they feel well informed about infant's care and denied any transportation barriers with visiting infant in the NICU. Parents denied any questions/concerns regarding the NICU. CSW informed parents that infant qualifies to apply for SSI benefits based on low birth weight. CSW explained application process and provided information on how to apply.   CSW asked if there was anything that CSW could do to be helpful at this time, parents reported no needs.   CSW will continue to offer resources/supports while infant is admitted to the NICU as parents opted for CSW to check in weekly.    CSW Plan/Description:  Psychosocial Support and  Ongoing Assessment of Needs, Perinatal Mood and Anxiety Disorder (PMADs) Education, Other Patient/Family Education, [employer] Income (SSI) Information    Antionette Poles, LCSW 03/11/2023, 12:16 PM

## 2023-03-11 NOTE — Progress Notes (Signed)
Subjective: Postpartum Day 2: Cesarean Delivery Patient reports incisional pain, tolerating PO, + flatus, and no problems voiding.   Incision is sore when rising from bed.  Objective: Vital signs in last 24 hours: Temp:  [98.1 F (36.7 C)-98.6 F (37 C)] 98.1 F (36.7 C) (10/02 0419) Pulse Rate:  [62-70] 70 (10/02 0610) Resp:  [16-18] 16 (10/02 0419) BP: (119-151)/(61-83) 133/77 (10/02 0610) SpO2:  [98 %-100 %] 100 % (10/02 0419)  Physical Exam:  General: alert, cooperative, and no distress Lochia: appropriate Uterine Fundus: firm Incision: healing well. Dressing C&D DVT Evaluation: No evidence of DVT seen on physical exam.  Recent Labs    03/09/23 0726 03/10/23 0610  HGB 11.9* 12.2  HCT 34.3* 35.5*    Assessment/Plan: Status post Cesarean section. Doing well postoperatively.  Continue current care. POD#2 s/p CS SIPE/nrFHT  Magnesium off about 1 pm yesterday Most BPs in normal range Continue Labetalol 400mg  TID Baby girl in NICU Still quite sore. Will observe another day   Roselle Locus II, MD 03/11/2023, 8:42 AM

## 2023-03-12 ENCOUNTER — Ambulatory Visit: Payer: BC Managed Care – PPO

## 2023-03-12 MED ORDER — OXYCODONE HCL 5 MG PO TABS: 5.0000 mg | ORAL_TABLET | ORAL | 0 refills | Status: AC | PRN

## 2023-03-12 MED ORDER — LABETALOL HCL 200 MG PO TABS: 400.0000 mg | ORAL_TABLET | Freq: Three times a day (TID) | ORAL | 3 refills | Status: AC

## 2023-03-12 NOTE — Discharge Summary (Signed)
Postpartum Discharge Summary  Date of Service March 12, 2023     Patient Name: Rebecca [name] DOB: 05-07-1991 MRN: [id number]  Date of admission: 03/03/2023 Delivery date:03/09/2023 Delivering provider: Damaris Hippo A Date of discharge: 03/12/2023  Admitting diagnosis: Preeclampsia [O14.90] Intrauterine pregnancy: [redacted]w[redacted]d     Secondary diagnosis:  Principal Problem:   Preeclampsia  Additional problems: Polycystic kidney disease, chronic hypertension    Discharge diagnosis: Preeclampsia (severe)                                              Post partum procedures: not applicable Augmentation: N/A Complications: None  Hospital course: Sceduled C/S   32 y.o. yo G2P0010 at [redacted]w[redacted]d was admitted to the hospital 03/03/2023 for scheduled cesarean section with the following indication:Non-Reassuring FHR.Delivery details are as follows:  Membrane Rupture Time/Date: 1:56 PM,03/09/2023  Delivery Method:C-Section, Low Transverse Operative Delivery:N/A Details of operation can be found in separate operative note.  Patient had a postpartum course complicated bynothing.  She is ambulating, tolerating a regular diet, passing flatus, and urinating well. Patient is discharged home in stable condition on  03/12/23        Newborn Data: Birth date:03/09/2023 Birth time:1:57 PM Gender:Female Living status:  Apgars:7 ,8  Weight:730 g    Magnesium Sulfate received: Yes: Neuroprotection BMZ received: Yes Rhophylac:No MMR:N/A T-DaP:Given prenatally Flu: N/A RSV Vaccine received: No Transfusion:No Immunizations administered: Immunization History  Administered Date(s) Administered   HPV Quadrivalent 10/13/2006   PFIZER(Purple Top)SARS-COV-2 Vaccination 08/18/2019, 09/12/2019, 09/29/2019, 05/30/2020   Tdap 10/13/2006, 07/03/2021    Physical exam  Vitals:   03/11/23 1955 03/12/23 0054 03/12/23 0400 03/12/23 0802  BP: (!) 143/84 122/65 135/78 122/69  Pulse: 71 85 70 84  Resp: 18 18 19 19    Temp: 98.4 F (36.9 C) 98.4 F (36.9 C) 98.2 F (36.8 C) 99.1 F (37.3 C)  TempSrc: Oral Oral Oral Oral  SpO2: 99% 97% 98% 99%  Weight:      Height:       General: alert, cooperative, and no distress Lochia: appropriate Uterine Fundus: firm Incision: Healing well with no significant drainage DVT Evaluation: No evidence of DVT seen on physical exam. Labs: Lab Results  Component Value Date   WBC 23.2 (H) 03/10/2023   HGB 12.2 03/10/2023   HCT 35.5 (L) 03/10/2023   MCV 95.9 03/10/2023   PLT 192 03/10/2023      Latest Ref Rng & Units 03/10/2023    6:10 AM  CMP  Glucose 70 - 99 mg/dL 413   BUN 6 - 20 mg/dL 14   Creatinine 2.44 - 1.00 mg/dL 0.10   Sodium 272 - 536 mmol/L 134   Potassium 3.5 - 5.1 mmol/L 4.6   Chloride 98 - 111 mmol/L 103   CO2 22 - 32 mmol/L 23   Calcium 8.9 - 10.3 mg/dL 7.9   Total Protein 6.5 - 8.1 g/dL 5.2   Total Bilirubin 0.3 - 1.2 mg/dL 0.3   Alkaline Phos 38 - 126 U/L 84   AST 15 - 41 U/L 23   ALT 0 - 44 U/L 17    Edinburgh Score:    03/11/2023    8:59 PM  Edinburgh Postnatal Depression Scale Screening Tool  I have been able to laugh and see the funny side of things. 0  I have looked forward with enjoyment to things.  0  I have blamed myself unnecessarily when things went wrong. 1  I have been anxious or worried for no good reason. 2  I have felt scared or panicky for no good reason. 2  Things have been getting on top of me. 1  I have been so unhappy that I have had difficulty sleeping. 1  I have felt sad or miserable. 0  I have been so unhappy that I have been crying. 1  The thought of harming myself has occurred to me. 0  Edinburgh Postnatal Depression Scale Total 8      After visit meds:  Allergies as of 03/12/2023       Reactions   Motrin [ibuprofen] Anaphylaxis, Swelling   Angioedema        Medication List     STOP taking these medications    aspirin EC 81 MG tablet   metoCLOPramide 10 MG tablet Commonly known as:  REGLAN       TAKE these medications    labetalol 200 MG tablet Commonly known as: NORMODYNE Take 2 tablets (400 mg total) by mouth every 8 (eight) hours. What changed:  how much to take when to take this   oxyCODONE 5 MG immediate release tablet Commonly known as: Oxy IR/ROXICODONE Take 1-2 tablets (5-10 mg total) by mouth every 4 (four) hours as needed for moderate pain.   PRENATAL PO Take 1 tablet by mouth daily.         Discharge home in stable condition Infant Feeding: Breast Infant Disposition:home with mother Discharge instruction: per After Visit Summary and Postpartum booklet. Activity: Advance as tolerated. Pelvic rest for 6 weeks.  Diet: routine diet Anticipated Birth Control: Unsure Postpartum Appointment:6 weeks Additional Postpartum F/U: Incision check 1 week and BP check 1 week Future Appointments: Future Appointments  Date Time Provider Department Center  03/27/2023  4:20 PM Thomasene Ripple, DO [facility] None   Follow up Visit:      03/12/2023 Jeani Hawking, MD

## 2023-03-13 ENCOUNTER — Ambulatory Visit (HOSPITAL_COMMUNITY): Payer: Self-pay

## 2023-03-13 ENCOUNTER — Inpatient Hospital Stay (HOSPITAL_COMMUNITY)
Admission: AD | Admit: 2023-03-13 | Discharge: 2023-03-14 | Disposition: A | Discharge status: Home / Self Care | Payer: BC Managed Care – PPO | Attending: Obstetrics & Gynecology | Admitting: Obstetrics & Gynecology

## 2023-03-13 DIAGNOSIS — O1003 Pre-existing essential hypertension complicating the puerperium: Secondary | ICD-10-CM | POA: Insufficient documentation

## 2023-03-13 DIAGNOSIS — O119 Pre-existing hypertension with pre-eclampsia, unspecified trimester: Secondary | ICD-10-CM

## 2023-03-13 MED ORDER — NIFEDIPINE 10 MG PO CAPS
20.0000 mg | ORAL_CAPSULE | Freq: Once | ORAL | Status: AC
  Administered 2023-03-13: 20 mg via ORAL
  Filled 2023-03-13: qty 2

## 2023-03-13 NOTE — MAU Note (Signed)
Pt says she del C/S on Monday 03-09-2023 And went home yesterday  Baby is in NICU - 28.3 weeks  Says was here x1 week - with high BP- on Mag  Then BP was good  Takes 400 mg labetalol 3x daily - took meds today . Was in NICU today - home at 7pm, then BP at  930pm bc felt light- headed  BP was 163/110 No H/A , Feels light - headed, sees floaters sometimes , no epigastric pain .

## 2023-03-13 NOTE — Lactation Note (Signed)
This note was copied from a baby's chart.  NICU Lactation Consultation Note  Patient Name: Rebecca [name] [id number] Date: 03/13/2023 Age:31 days  Reason for consult: Follow-up assessment; Primapara; 1st time breastfeeding; NICU baby; Infant < 6lbs; Preterm <34wks; Other (Comment); Maternal discharge (cHTN)  SUBJECTIVE Visited with family of 12 days old pre-term NICU female; Rebecca [name] is a P1 and reports she's been trying to pump consistently but the night time ones have been challenging. Her supply has started to slowly increase, praised her for her efforts. She got discharged yesterday. Reviewed discharge education, pump settings and the importance of consistent pumping for the onset of lactogenesis II, the prevention of engorgement and to protect her supply.  OBJECTIVE Infant data: Mother's Current Feeding Choice: Breast Milk and Donor Milk  O2 Device: Jet Vent FiO2 (%): 40 %  Maternal data: G2P0010 C-Section, Low Transverse Pumping frequency: 5 times/24 hours Pumped volume: 10 mL (10-13 ml) Flange Size: 21 Hands-free pumping top sizes: Large Rebecca [name])  Pump: Personal, Hands Free (Spectra S1, Mom Cozy)  ASSESSMENT Infant: Feeding Status: Scheduled 8-11-2-5 Feeding method: Tube/Gavage (Bolus)  Maternal: Milk volume: Low  INTERVENTIONS/PLAN Interventions: Interventions: Breast feeding basics reviewed; Coconut oil; DEBP; Education Discharge Education: Engorgement and breast care Tools: Pump; Flanges; Coconut oil; Hands-free pumping top Pump Education: Setup, frequency, and cleaning; Milk Storage  Plan: Encouraged pumping every 3 hours, ideally 8 pumping sessions/24 hours Breast massage, hand expression and coconut oil were also encouraged prior pumping She'll take all pump parts to baby's room after her discharge She'll switch her pump settings from initiate to maintain mode once she starts getting 20 ml of EBM combined   Rebecca [name] present and supportive. All questions and  concerns answered, family to contact [facility] services PRN.  Consult Status: NICU follow-up NICU Follow-up type: Verify absence of engorgement; Verify onset of copious milk   Rebecca [name] 03/13/2023, 7:00 PM

## 2023-03-13 NOTE — MAU Provider Note (Signed)
History     CSN: [id number]  Arrival date and time: 03/13/23 2311   None     Chief Complaint  Patient presents with   Hypertension   Rebecca [name] is a 32 y.o. [id number] at 5 Days postpartum from primary C/S. Patient receives care at [facility].  She presents today for elevated blood pressure.  Patient reports she delivered on 03/09/2023 d/t CHTN with SI PreE. She states she was sent home on Labetalol 400mg  TID and takes her dose at 6am, 2pm, and 10pm.  She states she was taking procardia, but had HA and was instructed to discontinue.  Patient denies HA and RUQ pain.  Patient states that she took her BP tonight after experiencing some lightheadedness and it was 163/110.     OB History     Gravida  2   Para  0   Term  0   Preterm  0   AB  1   Living  0      SAB  1   IAB  0   Ectopic  0   Multiple  0   Live Births              Past Medical History:  Diagnosis Date   Amenorrhea    Anxiety    Asthma    Asthma 07/24/2021   Atopic dermatitis 07/24/2021   Autosomal dominant polycystic kidney disease    Family history of breast cancer 07/26/2020   Family history of breast cancer 07/26/2020   Generalized anxiety disorder 07/24/2021   Hypertension    Irritable bowel syndrome with diarrhea 07/24/2021   Migraine headache without aura 11/03/2014   Migraine without aura    Migraine without aura    Mild intermittent asthma 07/24/2021   Ovarian cyst    Ovarian cyst    Polycystic kidney disease    UTI (urinary tract infection)    Vaginal Pap smear, abnormal     Past Surgical History:  Procedure Laterality Date   CESAREAN SECTION N/A 03/09/2023   Procedure: CESAREAN SECTION;  Surgeon: Lyn Henri, MD;  Location: [facility] LD ORS;  Service: Obstetrics;  Laterality: N/A;   WISDOM TOOTH EXTRACTION  2012/2013    Family History  Problem Relation Age of Onset   Kidney disease Mother    Hypertension Mother    Polycystic kidney disease Mother     Hypertension Father    Hypertension Sister    Breast cancer Sister 4       invasive ductal cancer, negative genetic testing   Diabetes Paternal Uncle    Heart attack Maternal Grandfather    Brain cancer Paternal Grandmother 48   Diabetes Paternal Grandfather    Heart attack Paternal Grandfather    Stomach cancer Paternal Grandfather 11   Breast cancer Other 41       MGM's sister   Prostate cancer Other        MGM's brother    Social History   Tobacco Use   Smoking status: Never   Smokeless tobacco: Never  Vaping Use   Vaping status: Never Used  Substance Use Topics   Alcohol use: No    Alcohol/week: 0.0 standard drinks of alcohol   Drug use: No    Allergies:  Allergies  Allergen Reactions   Motrin [Ibuprofen] Anaphylaxis and Swelling    Angioedema    Medications Prior to Admission  Medication Sig Dispense Refill Last Dose   labetalol (NORMODYNE) 200 MG tablet Take 2 tablets (  400 mg total) by mouth every 8 (eight) hours. 60 tablet 3    oxyCODONE (OXY IR/ROXICODONE) 5 MG immediate release tablet Take 1-2 tablets (5-10 mg total) by mouth every 4 (four) hours as needed for moderate pain. 30 tablet 0    Prenatal Vit-Fe Fumarate-FA (PRENATAL PO) Take 1 tablet by mouth daily.       Review of Systems  Gastrointestinal:  Negative for nausea and vomiting.  Genitourinary:  Positive for vaginal bleeding. Negative for dysuria and vaginal discharge.  Neurological:  Positive for light-headedness. Negative for dizziness and headaches.   Physical Exam   Blood pressure (!) 164/102, pulse 69, temperature 98 F (36.7 C), temperature source Oral, resp. rate 16, height 5' (1.524 m), weight 88.2 kg, last menstrual period 08/18/2022, unknown if currently breastfeeding.  Vitals:   03/13/23 2328 03/13/23 2339 03/13/23 2342 03/14/23 0039  BP: (!) 155/97 (!) 164/102 (!) 164/102 128/80   03/14/23 0112 03/14/23 0155  BP: 136/79 133/79     Physical Exam Vitals reviewed.   Constitutional:      Appearance: Normal appearance.  HENT:     Head: Normocephalic and atraumatic.  Eyes:     Conjunctiva/sclera: Conjunctivae normal.  Cardiovascular:     Rate and Rhythm: Normal rate.  Pulmonary:     Effort: Pulmonary effort is normal. No respiratory distress.  Musculoskeletal:        General: Normal range of motion.     Cervical back: Normal range of motion.  Skin:    General: Skin is warm and dry.  Neurological:     Mental Status: She is alert and oriented to person, place, and time.  Psychiatric:        Mood and Affect: Mood normal.        Behavior: Behavior normal.    MAU Course  Procedures Results for orders placed or performed during the hospital encounter of 03/13/23 (from the past 24 hour(s))  CBC     Status: Abnormal   Collection Time: 03/14/23 12:36 AM  Result Value Ref Range   WBC 12.3 (H) 4.0 - 10.5 K/uL   RBC 3.35 (L) 3.87 - 5.11 MIL/uL   Hemoglobin 11.1 (L) 12.0 - 15.0 g/dL   HCT 21.3 (L) 08.6 - 57.8 %   MCV 99.4 80.0 - 100.0 fL   MCH 33.1 26.0 - 34.0 pg   MCHC 33.3 30.0 - 36.0 g/dL   RDW 46.9 62.9 - 52.8 %   Platelets 177 150 - 400 K/uL   nRBC 0.0 0.0 - 0.2 %  Comprehensive metabolic panel     Status: Abnormal   Collection Time: 03/14/23 12:36 AM  Result Value Ref Range   Sodium 138 135 - 145 mmol/L   Potassium 4.3 3.5 - 5.1 mmol/L   Chloride 107 98 - 111 mmol/L   CO2 19 (L) 22 - 32 mmol/L   Glucose, Bld 98 70 - 99 mg/dL   BUN 18 6 - 20 mg/dL   Creatinine, Ser 4.13 0.44 - 1.00 mg/dL   Calcium 8.9 8.9 - 24.4 mg/dL   Total Protein 5.3 (L) 6.5 - 8.1 g/dL   Albumin 2.4 (L) 3.5 - 5.0 g/dL   AST 32 15 - 41 U/L   ALT 28 0 - 44 U/L   Alkaline Phosphatase 71 38 - 126 U/L   Total Bilirubin 0.4 0.3 - 1.2 mg/dL   GFR, Estimated >01 >02 mL/min   Anion gap 12 5 - 15    MDM Physical Exam Labs: CBC,  CMP Measure BP Antihypertensive Assessment and Plan  32 year old 5 Days Postpartum CHTN S/P MgSO4 x 24 hrs  -POC Reviewed. -Labs  ordered -Exam performed. -Discussed giving procardia now and patient agreeable. -Await labs   Cherre Robins 03/13/2023, 11:47 PM   Reassessment (1:09 AM) -BP normotensive range with Procardia dosing. -CBC returns without significant findings.  -Provider to bedside to discuss. -BP remain normotensive. -Discussed starting additional agent and patient is agreeable. States she has procardia at home. -Instructed to take once daily. -Message left at [facility] for patient to be scheduled for BP check on Monday.  Reassessment (2:01 AM) -CMP returns without significant findings. -Nurse to give precautions. -Discharge to home in stable condition.  Cherre Robins MSN, CNM Advanced Practice Provider, [facility]

## 2023-03-13 NOTE — MAU Provider Note (Incomplete)
  History     CSN: [id number]  Arrival date and time: 03/13/23 2311   None     Chief Complaint  Patient presents with  . Hypertension   HPI  {GYN/OB [id number]}  Past Medical History:  Diagnosis Date  . Amenorrhea   . Anxiety   . Asthma   . Asthma 07/24/2021  . Atopic dermatitis 07/24/2021  . Autosomal dominant polycystic kidney disease   . Family history of breast cancer 07/26/2020  . Family history of breast cancer 07/26/2020  . Generalized anxiety disorder 07/24/2021  . Hypertension   . Irritable bowel syndrome with diarrhea 07/24/2021  . Migraine headache without aura 11/03/2014  . Migraine without aura   . Migraine without aura   . Mild intermittent asthma 07/24/2021  . Ovarian cyst   . Ovarian cyst   . Polycystic kidney disease   . UTI (urinary tract infection)   . Vaginal Pap smear, abnormal     Past Surgical History:  Procedure Laterality Date  . CESAREAN SECTION N/A 03/09/2023   Procedure: CESAREAN SECTION;  Surgeon: Lyn Henri, MD;  Location: [facility] LD ORS;  Service: Obstetrics;  Laterality: N/A;  . WISDOM TOOTH EXTRACTION  2012/2013    Family History  Problem Relation Age of Onset  . Kidney disease Mother   . Hypertension Mother   . Polycystic kidney disease Mother   . Hypertension Father   . Hypertension Sister   . Breast cancer Sister 55       invasive ductal cancer, negative genetic testing  . Diabetes Paternal Uncle   . Heart attack Maternal Grandfather   . Brain cancer Paternal Grandmother 26  . Diabetes Paternal Grandfather   . Heart attack Paternal Grandfather   . Stomach cancer Paternal Grandfather 42  . Breast cancer Other 68       MGM's sister  . Prostate cancer Other        MGM's brother    Social History   Tobacco Use  . Smoking status: Never  . Smokeless tobacco: Never  Vaping Use  . Vaping status: Never Used  Substance Use Topics  . Alcohol use: No    Alcohol/week: 0.0 standard drinks of alcohol  . Drug use: No     Allergies:  Allergies  Allergen Reactions  . Motrin [Ibuprofen] Anaphylaxis and Swelling    Angioedema    Medications Prior to Admission  Medication Sig Dispense Refill Last Dose  . labetalol (NORMODYNE) 200 MG tablet Take 2 tablets (400 mg total) by mouth every 8 (eight) hours. 60 tablet 3   . oxyCODONE (OXY IR/ROXICODONE) 5 MG immediate release tablet Take 1-2 tablets (5-10 mg total) by mouth every 4 (four) hours as needed for moderate pain. 30 tablet 0   . Prenatal Vit-Fe Fumarate-FA (PRENATAL PO) Take 1 tablet by mouth daily.       Review of Systems Physical Exam   Blood pressure (!) 164/102, pulse 69, temperature 98 F (36.7 C), temperature source Oral, resp. rate 16, height 5' (1.524 m), weight 88.2 kg, last menstrual period 08/18/2022, unknown if currently breastfeeding.  Vitals:   03/13/23 2328 03/13/23 2339 03/13/23 2342  BP: (!) 155/97 (!) 164/102 (!) 164/102     Physical Exam  MAU Course  Procedures  MDM ***  Assessment and Plan  ***  Rebecca [name] 03/13/2023, 11:47 PM

## 2023-03-14 LAB — COMPREHENSIVE METABOLIC PANEL
ALT: 28 U/L (ref 0–44)
AST: 32 U/L (ref 15–41)
Albumin: 2.4 g/dL — ABNORMAL LOW (ref 3.5–5.0)
Alkaline Phosphatase: 71 U/L (ref 38–126)
Anion gap: 12 (ref 5–15)
BUN: 18 mg/dL (ref 6–20)
CO2: 19 mmol/L — ABNORMAL LOW (ref 22–32)
Calcium: 8.9 mg/dL (ref 8.9–10.3)
Chloride: 107 mmol/L (ref 98–111)
Creatinine, Ser: 0.69 mg/dL (ref 0.44–1.00)
GFR, Estimated: >60 mL/min (ref >60)
Glucose, Bld: 98 mg/dL (ref 70–99)
Potassium: 4.3 mmol/L (ref 3.5–5.1)
Sodium: 138 mmol/L (ref 135–145)
Total Bilirubin: 0.4 mg/dL (ref 0.3–1.2)
Total Protein: 5.3 g/dL — ABNORMAL LOW (ref 6.5–8.1)

## 2023-03-14 LAB — CBC
HCT: 33.3 % — ABNORMAL LOW (ref 36.0–46.0)
Hemoglobin: 11.1 g/dL — ABNORMAL LOW (ref 12.0–15.0)
MCH: 33.1 pg (ref 26.0–34.0)
MCHC: 33.3 g/dL (ref 30.0–36.0)
MCV: 99.4 fL (ref 80.0–100.0)
Platelets: 177 10*3/uL (ref 150–400)
RBC: 3.35 MIL/uL — ABNORMAL LOW (ref 3.87–5.11)
RDW: 12.7 % (ref 11.5–15.5)
WBC: 12.3 10*3/uL — ABNORMAL HIGH (ref 4.0–10.5)
nRBC: 0 % (ref 0.0–0.2)

## 2023-03-14 MED ORDER — NIFEDIPINE ER OSMOTIC RELEASE 30 MG PO TB24: 30.0000 mg | ORAL_TABLET | Freq: Every day | ORAL | 1 refills | Status: AC

## 2023-03-18 ENCOUNTER — Other Ambulatory Visit: Payer: BC Managed Care – PPO

## 2023-03-18 ENCOUNTER — Ambulatory Visit: Payer: BC Managed Care – PPO

## 2023-03-19 ENCOUNTER — Ambulatory Visit: Payer: BC Managed Care – PPO

## 2023-03-22 ENCOUNTER — Ambulatory Visit (HOSPITAL_COMMUNITY): Payer: Self-pay

## 2023-03-22 NOTE — Lactation Note (Signed)
This note was copied from a baby's chart.  NICU Lactation Consultation Note  Patient Name: Rebecca [name] [id number] Date: 03/22/2023 Age:31 days  Reason for consult: Follow-up assessment; NICU baby; Primapara; 1st time breastfeeding; Preterm <34wks; Infant < 6lbs  SUBJECTIVE  LC in to visit with P1 Mom and FOB of preterm baby "Rebecca [name]" in the NICU.   Mom reports that her milk volume has decreased from peaking at 5-6 days.  Mom expresses 30 ml at a good pumping.  Mom using a Spectra pump and has a pumping bra.  Encouraged hands on pumping, FOB states he helps massage while she pumps.  Mom provided with a handout on how to increase milk supply for NICU Moms.    Mom shared that she hasn't been able to hold "Rebecca [name]" yet due to instability and being on Jet vent.  LC shared how beneficial holding baby STS will be on her milk supply.  Encouraged Mom to get enough sleep, but recommended waking to pump between 2-5 am once when her prolactin hormone is the highest.  Reviewed importance of pumping 8 times per 24 hrs.  Mom aware of lactation support and encouraged her to ask for LC prn.  OBJECTIVE Infant data: No data recorded O2 Device: Jet Vent FiO2 (%): 27 %  Infant feeding assessment No data recorded  Maternal data: G2P0010 C-Section, Low Transverse Pumping frequency: 7 times per 24 hrs Pumped volume: 25 mL Flange Size: 21 Hands-free pumping top sizes: Large Wallace Cullens)  Pump: Personal, Hands Free (Spectra S1, Mom Cozy)  Feeding Status: Scheduled 9-12-3-6 Feeding method: Tube/Gavage (Bolus)  Maternal: Milk volume: Low  INTERVENTIONS/PLAN Interventions: Interventions: Breast feeding basics reviewed; Hand express; Breast massage; DEBP; Education; CDC Guidelines for Breast Pump Cleaning Tools: Pump; Flanges Pump Education: Setup, frequency, and cleaning; Milk Storage  Plan: Consult Status: NICU follow-up NICU Follow-up type: Weekly NICU follow up   Judee Clara 03/22/2023,  3:30 PM

## 2023-03-25 ENCOUNTER — Ambulatory Visit: Payer: BC Managed Care – PPO

## 2023-03-25 ENCOUNTER — Other Ambulatory Visit: Payer: BC Managed Care – PPO

## 2023-03-26 ENCOUNTER — Ambulatory Visit: Payer: BC Managed Care – PPO

## 2023-03-27 ENCOUNTER — Ambulatory Visit: Payer: BC Managed Care – PPO | Admitting: Cardiology

## 2023-03-27 ENCOUNTER — Ambulatory Visit (HOSPITAL_COMMUNITY): Payer: Self-pay

## 2023-03-27 NOTE — Lactation Note (Signed)
This note was copied from a baby's chart.  NICU Lactation Consultation Note  Patient Name: Rebecca [name] [id number] Date: 03/27/2023 Age:31 wk.o.  Reason for consult: Weekly NICU follow-up; Primapara; 1st time breastfeeding; NICU baby; Preterm <34wks; Infant < 6lbs; Mother's request; RN request  SUBJECTIVE Visited with family of 63 25/87 weeks old AGA NICU female; Ms. Groven is a P1 and reports that her supply continues to dwindle; pumping is not consistent at night and voiced that STS care with baby "Leroy Sea" is still a hit or miss due to baby's status, she went back to NPO yesterday. She power pumps in the morning after going 8 hours without pumping at night. Explained the importance of consistent pumping and revised strategies to increase supply. Let her know that it will take about a week to see the results from these interventions. She is concerned because she'll be going back to work next week, discussed additional measurement such as keeping herself hydrated and possibly trying galactagogues. She's aware they will only work adjuvant with consistent pumping.  OBJECTIVE Infant data: Mother's Current Feeding Choice: -- (NPO)  O2 Device: Jet Vent FiO2 (%): 35 %  Infant feeding assessment No data recorded  Maternal data: G2P0010 C-Section, Low Transverse Pumping frequency: 6 times/24 hours Pumped volume: 7 mL Flange Size: 21 Hands-free pumping top sizes: Large Wallace Cullens)  Pump: Personal, Hands Free (Spectra S1, Mom Cozy)  ASSESSMENT Infant: Feeding Status: NPO Feeding method: Tube/Gavage (Bolus)  Maternal: Milk volume: Low  INTERVENTIONS/PLAN Interventions: Interventions: Breast feeding basics reviewed; Coconut oil; DEBP; Education Tools: Pump; Flanges; Coconut oil; Hands-free pumping top Pump Education: Setup, frequency, and cleaning; Milk Storage  Plan: Encouraged pumping every 3 hours, ideally 8 pumping sessions/24 hours She'll continue power pumping in the AM She'll  engage in STS care as much as possible as baby status allows   FOB present and supportive. All questions and concerns answered, family to contact [facility] services PRN.  Consult Status: NICU follow-up NICU Follow-up type: Weekly NICU follow up   Carnie Bruemmer S Philis Nettle 03/27/2023, 6:11 PM

## 2023-04-06 ENCOUNTER — Telehealth (HOSPITAL_COMMUNITY): Payer: Self-pay | Admitting: *Deleted

## 2023-04-06 NOTE — Telephone Encounter (Signed)
04/06/2023  Name: Rebecca [name] MRN: [id number] DOB: [birth year]  Reason for Call:  Transition of Care Hospital Discharge Call  Contact Status: Patient Contact Status: Message  Language assistant needed:          Follow-Up Questions:    Inocente Salles Postnatal Depression Scale:  In the Past 7 Days:    PHQ2-9 Depression Scale:     Discharge Follow-up:    Post-discharge interventions: NA  Salena Saner, RN 04/06/2023 10:43

## 2023-08-10 ENCOUNTER — Other Ambulatory Visit: Payer: Self-pay | Admitting: Internal Medicine

## 2023-08-10 DIAGNOSIS — Z1231 Encounter for screening mammogram for malignant neoplasm of breast: Secondary | ICD-10-CM

## 2023-09-03 ENCOUNTER — Ambulatory Visit
Admission: RE | Admit: 2023-09-03 | Discharge: 2023-09-03 | Disposition: A | Discharge status: Home / Self Care | Payer: Self-pay | Source: Ambulatory Visit | Attending: Internal Medicine | Admitting: Internal Medicine

## 2023-09-03 ENCOUNTER — Ambulatory Visit: Payer: Self-pay

## 2023-09-03 DIAGNOSIS — Z1231 Encounter for screening mammogram for malignant neoplasm of breast: Secondary | ICD-10-CM

## 2023-09-21 ENCOUNTER — Other Ambulatory Visit: Payer: Self-pay | Admitting: Internal Medicine

## 2023-09-21 DIAGNOSIS — R1030 Lower abdominal pain, unspecified: Secondary | ICD-10-CM

## 2023-10-02 ENCOUNTER — Other Ambulatory Visit

## 2023-10-08 ENCOUNTER — Ambulatory Visit
Admission: RE | Admit: 2023-10-08 | Discharge: 2023-10-08 | Disposition: A | Discharge status: Home / Self Care | Source: Ambulatory Visit | Attending: Internal Medicine | Admitting: Internal Medicine

## 2023-10-08 DIAGNOSIS — R1030 Lower abdominal pain, unspecified: Secondary | ICD-10-CM

## 2024-07-13 ENCOUNTER — Other Ambulatory Visit: Payer: Self-pay | Admitting: Internal Medicine

## 2024-07-13 DIAGNOSIS — Z1231 Encounter for screening mammogram for malignant neoplasm of breast: Secondary | ICD-10-CM

## 2024-09-05 ENCOUNTER — Ambulatory Visit
